# Patient Record
Sex: Female | Born: 1955 | Race: White | Hispanic: No | Marital: Married | Smoking: Never smoker
Health system: Southern US, Community
[De-identification: ages and names within clinical notes are randomized; demographics above are authoritative.]

## PROBLEM LIST (undated history)

## (undated) DIAGNOSIS — M549 Dorsalgia, unspecified: Principal | ICD-10-CM

## (undated) DIAGNOSIS — T4145XA Adverse effect of unspecified anesthetic, initial encounter: Secondary | ICD-10-CM

## (undated) DIAGNOSIS — E7800 Pure hypercholesterolemia, unspecified: Secondary | ICD-10-CM

## (undated) DIAGNOSIS — G8929 Other chronic pain: Secondary | ICD-10-CM

## (undated) DIAGNOSIS — T8859XA Other complications of anesthesia, initial encounter: Secondary | ICD-10-CM

## (undated) HISTORY — PX: PARTIAL HYSTERECTOMY: SHX80

## (undated) HISTORY — DX: Dorsalgia, unspecified: M54.9

## (undated) HISTORY — DX: Other chronic pain: G89.29

## (undated) HISTORY — PX: OTHER SURGICAL HISTORY: SHX169

---

## 1998-06-21 ENCOUNTER — Encounter: Payer: Self-pay | Admitting: Neurosurgery

## 1998-06-21 ENCOUNTER — Ambulatory Visit (HOSPITAL_COMMUNITY): Admission: RE | Admit: 1998-06-21 | Discharge: 1998-06-21 | Payer: Self-pay | Admitting: Neurosurgery

## 1998-07-05 ENCOUNTER — Ambulatory Visit (HOSPITAL_COMMUNITY): Admission: RE | Admit: 1998-07-05 | Discharge: 1998-07-05 | Payer: Self-pay | Admitting: Neurosurgery

## 1998-07-19 ENCOUNTER — Ambulatory Visit (HOSPITAL_COMMUNITY): Admission: RE | Admit: 1998-07-19 | Discharge: 1998-07-19 | Payer: Self-pay | Admitting: Neurosurgery

## 1998-07-26 ENCOUNTER — Other Ambulatory Visit: Admission: RE | Admit: 1998-07-26 | Discharge: 1998-07-26 | Payer: Self-pay | Admitting: Obstetrics & Gynecology

## 1998-09-18 ENCOUNTER — Encounter: Payer: Self-pay | Admitting: Obstetrics & Gynecology

## 1998-09-18 ENCOUNTER — Ambulatory Visit (HOSPITAL_COMMUNITY): Admission: RE | Admit: 1998-09-18 | Discharge: 1998-09-18 | Payer: Self-pay | Admitting: Obstetrics & Gynecology

## 1999-08-21 ENCOUNTER — Other Ambulatory Visit: Admission: RE | Admit: 1999-08-21 | Discharge: 1999-08-21 | Payer: Self-pay | Admitting: Obstetrics & Gynecology

## 2000-04-26 ENCOUNTER — Ambulatory Visit (HOSPITAL_COMMUNITY): Admission: RE | Admit: 2000-04-26 | Discharge: 2000-04-26 | Payer: Self-pay | Admitting: Neurosurgery

## 2000-04-26 ENCOUNTER — Encounter: Payer: Self-pay | Admitting: Neurosurgery

## 2000-09-22 ENCOUNTER — Other Ambulatory Visit: Admission: RE | Admit: 2000-09-22 | Discharge: 2000-09-22 | Payer: Self-pay | Admitting: Obstetrics & Gynecology

## 2001-03-04 ENCOUNTER — Emergency Department (HOSPITAL_COMMUNITY): Admission: EM | Admit: 2001-03-04 | Discharge: 2001-03-05 | Payer: Self-pay | Admitting: *Deleted

## 2001-03-04 ENCOUNTER — Encounter: Payer: Self-pay | Admitting: *Deleted

## 2001-03-15 ENCOUNTER — Encounter: Payer: Self-pay | Admitting: Internal Medicine

## 2001-03-15 ENCOUNTER — Ambulatory Visit (HOSPITAL_COMMUNITY): Admission: RE | Admit: 2001-03-15 | Discharge: 2001-03-15 | Payer: Self-pay | Admitting: Internal Medicine

## 2001-03-23 ENCOUNTER — Encounter: Admission: RE | Admit: 2001-03-23 | Discharge: 2001-03-23 | Payer: Self-pay | Admitting: Neurosurgery

## 2001-03-23 ENCOUNTER — Encounter: Payer: Self-pay | Admitting: Neurosurgery

## 2001-05-09 ENCOUNTER — Encounter: Payer: Self-pay | Admitting: Neurosurgery

## 2001-05-11 ENCOUNTER — Inpatient Hospital Stay (HOSPITAL_COMMUNITY): Admission: RE | Admit: 2001-05-11 | Discharge: 2001-05-14 | Payer: Self-pay | Admitting: Neurosurgery

## 2001-05-11 ENCOUNTER — Encounter: Payer: Self-pay | Admitting: Neurosurgery

## 2001-06-24 ENCOUNTER — Encounter: Admission: RE | Admit: 2001-06-24 | Discharge: 2001-06-24 | Payer: Self-pay | Admitting: Neurosurgery

## 2001-06-24 ENCOUNTER — Encounter: Payer: Self-pay | Admitting: Neurosurgery

## 2001-09-29 ENCOUNTER — Other Ambulatory Visit: Admission: RE | Admit: 2001-09-29 | Discharge: 2001-09-29 | Payer: Self-pay | Admitting: Obstetrics & Gynecology

## 2001-11-17 ENCOUNTER — Encounter: Admission: RE | Admit: 2001-11-17 | Discharge: 2001-11-17 | Payer: Self-pay | Admitting: Neurosurgery

## 2001-11-17 ENCOUNTER — Encounter: Payer: Self-pay | Admitting: Neurosurgery

## 2002-02-08 ENCOUNTER — Encounter: Admission: RE | Admit: 2002-02-08 | Discharge: 2002-02-08 | Payer: Self-pay | Admitting: Neurosurgery

## 2002-02-08 ENCOUNTER — Encounter: Payer: Self-pay | Admitting: Neurosurgery

## 2002-10-11 ENCOUNTER — Other Ambulatory Visit: Admission: RE | Admit: 2002-10-11 | Discharge: 2002-10-11 | Payer: Self-pay | Admitting: Obstetrics & Gynecology

## 2003-02-05 ENCOUNTER — Encounter: Payer: Self-pay | Admitting: Neurosurgery

## 2003-02-07 ENCOUNTER — Ambulatory Visit (HOSPITAL_COMMUNITY): Admission: RE | Admit: 2003-02-07 | Discharge: 2003-02-08 | Payer: Self-pay | Admitting: Neurosurgery

## 2003-02-07 ENCOUNTER — Encounter: Payer: Self-pay | Admitting: Neurosurgery

## 2003-02-14 ENCOUNTER — Inpatient Hospital Stay (HOSPITAL_COMMUNITY): Admission: RE | Admit: 2003-02-14 | Discharge: 2003-02-14 | Payer: Self-pay | Admitting: Neurosurgery

## 2003-05-07 ENCOUNTER — Other Ambulatory Visit: Admission: RE | Admit: 2003-05-07 | Discharge: 2003-05-07 | Payer: Self-pay | Admitting: Dermatology

## 2003-11-01 ENCOUNTER — Other Ambulatory Visit: Admission: RE | Admit: 2003-11-01 | Discharge: 2003-11-01 | Payer: Self-pay | Admitting: Obstetrics & Gynecology

## 2003-11-08 ENCOUNTER — Encounter: Admission: RE | Admit: 2003-11-08 | Discharge: 2003-11-08 | Payer: Self-pay | Admitting: Obstetrics & Gynecology

## 2003-12-11 ENCOUNTER — Encounter (HOSPITAL_COMMUNITY): Admission: RE | Admit: 2003-12-11 | Discharge: 2004-01-10 | Payer: Self-pay | Admitting: Neurosurgery

## 2004-01-11 ENCOUNTER — Encounter (HOSPITAL_COMMUNITY): Admission: RE | Admit: 2004-01-11 | Discharge: 2004-02-10 | Payer: Self-pay | Admitting: Neurosurgery

## 2004-02-11 ENCOUNTER — Encounter (HOSPITAL_COMMUNITY): Admission: RE | Admit: 2004-02-11 | Discharge: 2004-03-12 | Payer: Self-pay | Admitting: Neurosurgery

## 2004-10-15 ENCOUNTER — Encounter: Admission: RE | Admit: 2004-10-15 | Discharge: 2004-10-15 | Payer: Self-pay | Admitting: Neurosurgery

## 2004-10-30 ENCOUNTER — Ambulatory Visit (HOSPITAL_COMMUNITY): Admission: RE | Admit: 2004-10-30 | Discharge: 2004-10-30 | Payer: Self-pay | Admitting: Neurosurgery

## 2004-11-05 ENCOUNTER — Other Ambulatory Visit: Admission: RE | Admit: 2004-11-05 | Discharge: 2004-11-05 | Payer: Self-pay | Admitting: Obstetrics & Gynecology

## 2004-11-06 ENCOUNTER — Encounter: Admission: RE | Admit: 2004-11-06 | Discharge: 2004-11-06 | Payer: Self-pay | Admitting: Neurosurgery

## 2004-11-20 ENCOUNTER — Encounter: Admission: RE | Admit: 2004-11-20 | Discharge: 2004-11-20 | Payer: Self-pay | Admitting: Neurosurgery

## 2004-12-03 ENCOUNTER — Encounter: Admission: RE | Admit: 2004-12-03 | Discharge: 2004-12-03 | Payer: Self-pay | Admitting: Neurosurgery

## 2005-03-10 ENCOUNTER — Encounter: Admission: RE | Admit: 2005-03-10 | Discharge: 2005-03-10 | Payer: Self-pay | Admitting: Neurosurgery

## 2005-03-13 ENCOUNTER — Ambulatory Visit (HOSPITAL_COMMUNITY): Admission: AD | Admit: 2005-03-13 | Discharge: 2005-03-15 | Payer: Self-pay | Admitting: Neurosurgery

## 2005-07-31 ENCOUNTER — Ambulatory Visit (HOSPITAL_COMMUNITY): Admission: RE | Admit: 2005-07-31 | Discharge: 2005-07-31 | Payer: Self-pay | Admitting: Neurosurgery

## 2005-12-10 ENCOUNTER — Other Ambulatory Visit: Admission: RE | Admit: 2005-12-10 | Discharge: 2005-12-10 | Payer: Self-pay | Admitting: Obstetrics & Gynecology

## 2005-12-28 ENCOUNTER — Encounter: Admission: RE | Admit: 2005-12-28 | Discharge: 2005-12-28 | Payer: Self-pay | Admitting: Obstetrics & Gynecology

## 2006-02-03 ENCOUNTER — Encounter: Payer: Self-pay | Admitting: Neurosurgery

## 2006-02-05 ENCOUNTER — Inpatient Hospital Stay (HOSPITAL_COMMUNITY): Admission: RE | Admit: 2006-02-05 | Discharge: 2006-02-06 | Payer: Self-pay | Admitting: Neurosurgery

## 2006-04-21 ENCOUNTER — Encounter: Admission: RE | Admit: 2006-04-21 | Discharge: 2006-04-21 | Payer: Self-pay | Admitting: Neurosurgery

## 2006-05-10 ENCOUNTER — Ambulatory Visit (HOSPITAL_COMMUNITY): Admission: RE | Admit: 2006-05-10 | Discharge: 2006-05-10 | Payer: Self-pay | Admitting: Internal Medicine

## 2006-05-10 ENCOUNTER — Ambulatory Visit: Payer: Self-pay | Admitting: Internal Medicine

## 2006-10-12 IMAGING — CT CT L SPINE W/ CM
3 of 7 series · 13 of 33 positions shown, 15 images · IV contrast (omnipaque)
Comparison: none

CLINICAL DATA: Spondylosis.  Left leg pain.  Prior interbody fusion.  Lumbar myelogram to include lower thoracic region.
CT LUMBAR MYELOGRAM WITH CONTRAST:
20cc of Omnipaque 180.
Fluoroscopic guidance was provided by Dr. Lakeisha Chakravarty.  He instilled Omnipaque into the subarachnoid space at the L2-3 level.  I obtained routine myelographic films including views of the lower thoracic region in this patient who has a dorsal column stimulator.
Five non-rib-bearing lumbar vertebrae.   interbody cages at L4-5.  Dorsum column stimulator electrodes enter in the upper lumbar region right around L1-2 level and the electrodes span over lower thoracic vertebrae T10, T11, and T12.
Interbody cages at L4-5 appear in satisfactory position.  Negative for nerve root cutoff or attenuation of root sleeves.  Mild anterior extradural defect L2-3.  Cross-table lateral view revealing minimal retrolisthesis at L2 and L3 by a couple millimeters.Mild subsidence of cages into L4 and L5 bodies appears unremarkable.  Incidentally unfused ring apophysis (limbus vertebrae) at L4, a variant of normal.

[Series 102: l-spine helical · axial · 0.41mm/px · z∈[-216,+39]mm · 7 of 545 slices shown, 9 images]
[im 69/545  soft-tissue]
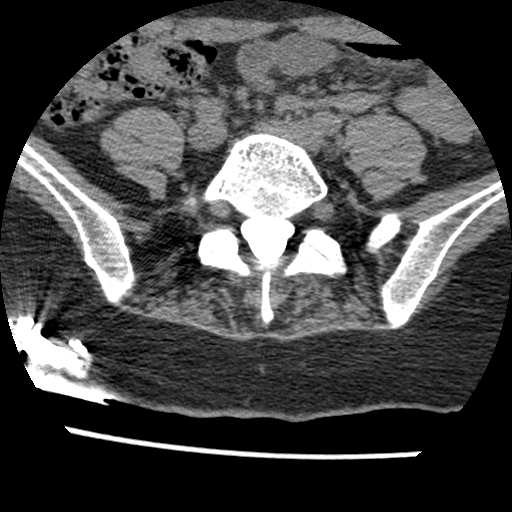
[im 69/545  bone]
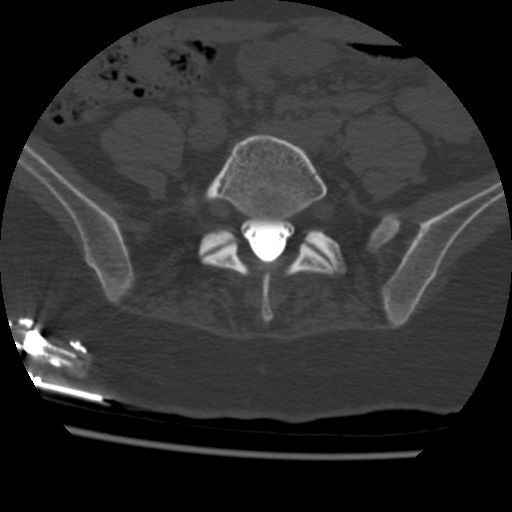
[im 137/545  bone]
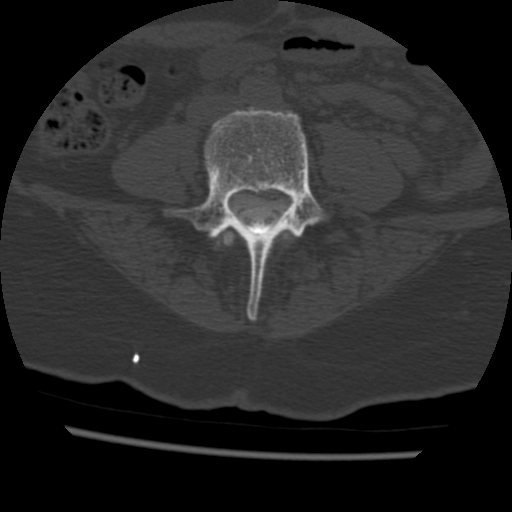
[im 205/545  bone]
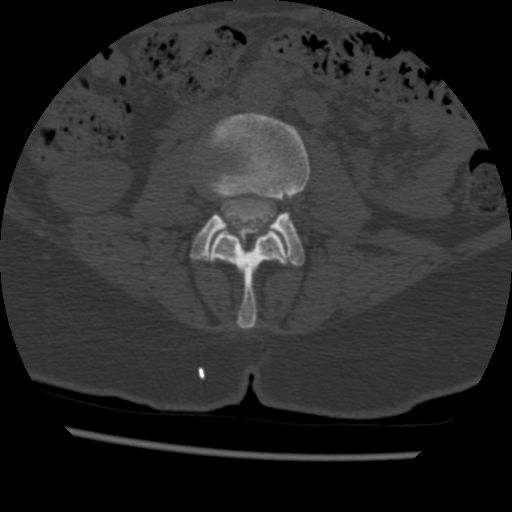
[im 273/545  bone]
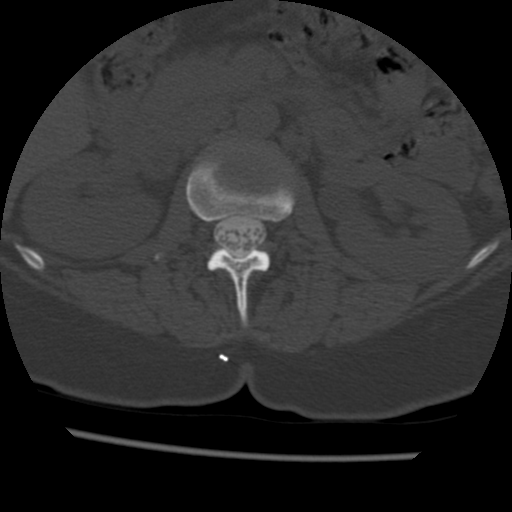
[im 341/545  soft-tissue]
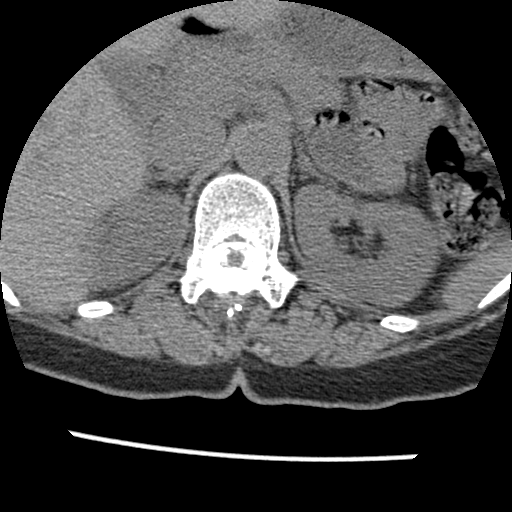
[im 341/545  bone]
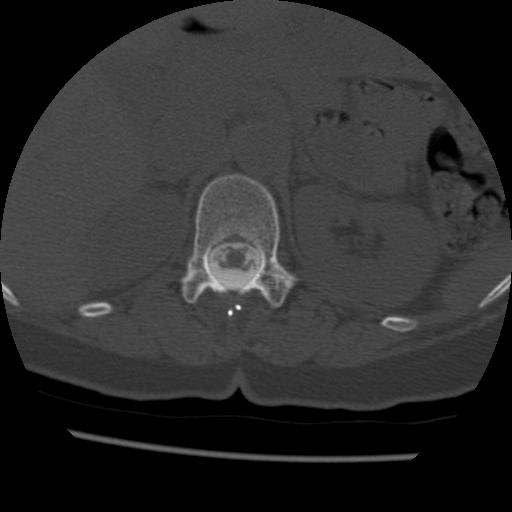
[im 409/545  bone]
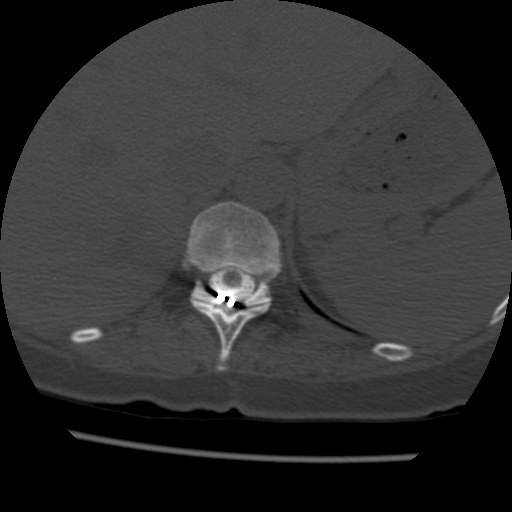
[im 477/545  bone]
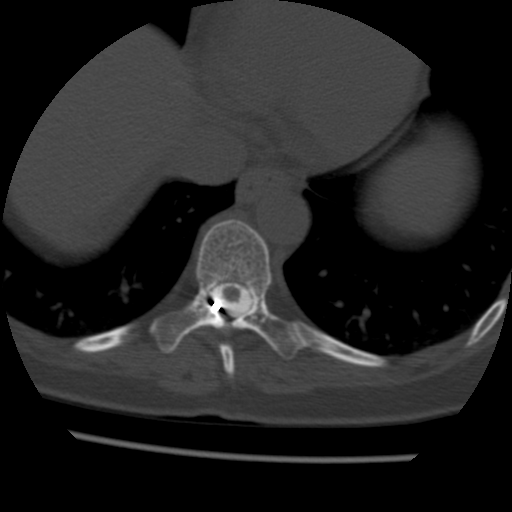

[Series 103: reformatted · coronal · 0.68mm/px · 5 of 64 slices shown (1 of 2)]
[im 11/64  bone]
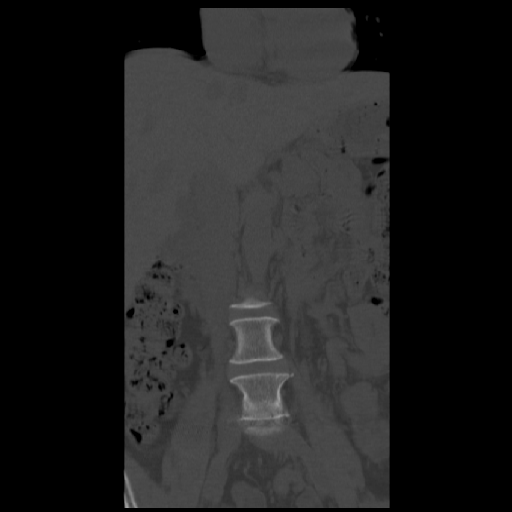
[im 22/64  bone]
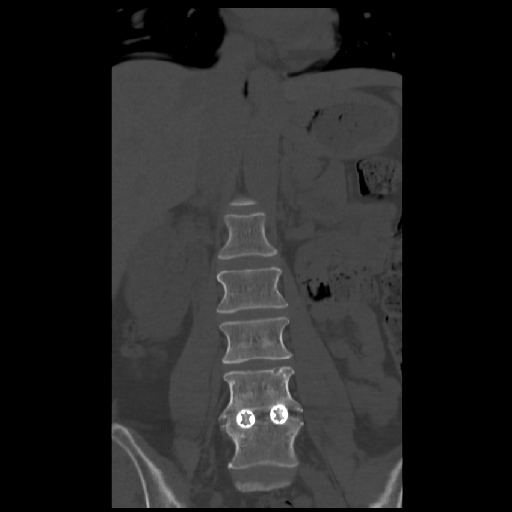
[im 32/64  bone]
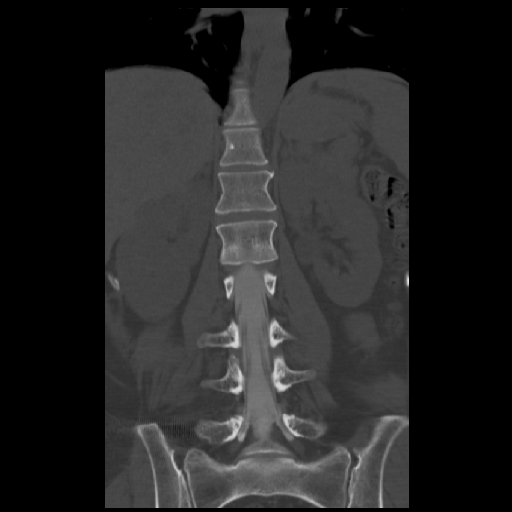
[im 43/64  bone]
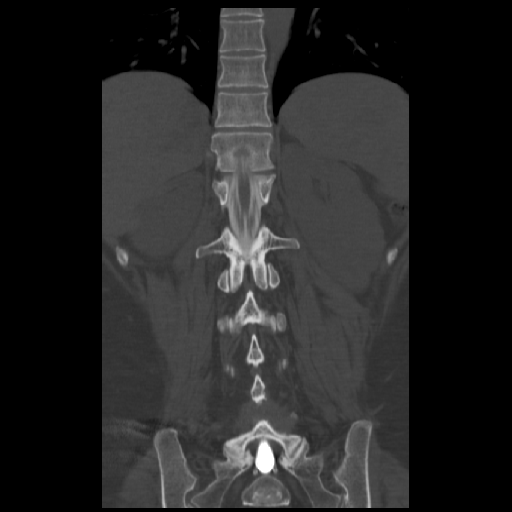
[im 53/64  bone]
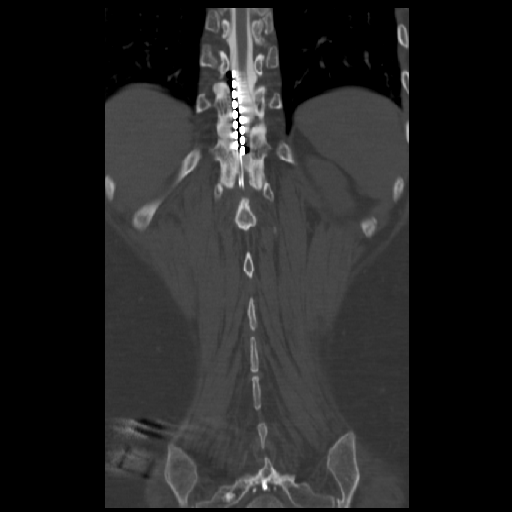

[Series 105: reformatted · sagittal · 0.68mm/px · 1 of 42 slices shown (2 of 2)]
[im 21/42  bone]
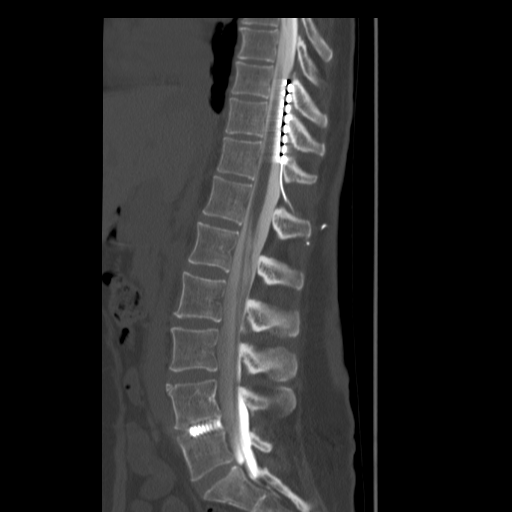

[13 of 33 positions shown; findings below may reference images not displayed]

IMPRESSION: Unremarkable lumbar myelogram.  See comments above.  CT myelography is to follow.
POST-MYELOGRAM CT SCAN (INTRATHECAL CONTRAST OMNIPAQUE 180):
Following the myelogram, transaxial cuts were acquired from the inferior T7 to S1 level.From the axial dataset,images were reconstructed in coronal and sagittal planes.  Findings are as follows.  Lower thoracic region unremarkable.  Dorsal column stimulator leads/electrodes are noted entering the posterior aspect of the spinal canal at T11-12 level.  Normal caliber of the conus medullaris and thoracic cord up to T8 level.  Minimal shallow disc protrusion paracentral and posterolateral left L2-3.  Slightly larger but still small, broad based and shallow disc protrusion posterolateral/lateral on the left at L3-4 with encroachment on the left L3 nerve root in the lateral aspect of the foramen and importantly caudal extension of herniated nuclear material behind the mid superior aspect of L4 body in the midline and to the left where it involves the left L4 lateral recess and L4 nerve root.Minimal anterolisthesis of L3 on L4 by approximately 3mm.  No pars defects.Degenerative facet arthropathic changes.  
At L4-5, postoperative changes are noted.  Fusion plugs in satisfactory position.  unusual sclerosis or lysis about the cages.  Again, cages are well positioned.  On the right there is a curvilinear density projecting off the posterolateral and inferior aspect of the interspace or L4 body and projecting into the right L3-4 foramen but not appearing to cause mass effect on the exiting right L4 nerve root.  This density, possibly a piece of bone, is located in the inferior aspect of the right upper foramen.There may be a fracture, possibly a stress fracture, involving the right aspect of pedicle and vertebral body of L5.  If this does represent a fracture, it may be healing,i.e. the margin of sclerosis, or it may simply represent a prominent anterior trabecular groove.
The L5-S1 level is unremarkable.  No stenosis.  Mild posterior annular bulging slightly eccentrically prominent on the left.  The S1 nerve root sleeves are well opacified.
IMPRESSION: The main observation is the HNP on the left at L3-4 with caudal extension into the left L4 lateral recess.

## 2008-11-29 ENCOUNTER — Encounter: Admission: RE | Admit: 2008-11-29 | Discharge: 2008-11-29 | Payer: Self-pay | Admitting: Neurosurgery

## 2009-01-01 ENCOUNTER — Encounter: Admission: RE | Admit: 2009-01-01 | Discharge: 2009-01-01 | Payer: Self-pay | Admitting: Neurosurgery

## 2009-02-19 ENCOUNTER — Inpatient Hospital Stay (HOSPITAL_COMMUNITY): Admission: RE | Admit: 2009-02-19 | Discharge: 2009-03-01 | Payer: Self-pay | Admitting: Neurosurgery

## 2009-03-07 ENCOUNTER — Encounter (INDEPENDENT_AMBULATORY_CARE_PROVIDER_SITE_OTHER): Payer: Self-pay | Admitting: Neurosurgery

## 2009-03-07 ENCOUNTER — Ambulatory Visit: Payer: Self-pay | Admitting: Vascular Surgery

## 2009-03-07 ENCOUNTER — Ambulatory Visit: Admission: RE | Admit: 2009-03-07 | Discharge: 2009-03-07 | Payer: Self-pay | Admitting: Neurosurgery

## 2011-01-13 LAB — TYPE AND SCREEN
ABO/RH(D): O POS
Antibody Screen: NEGATIVE

## 2011-01-13 LAB — DIFFERENTIAL
Basophils Absolute: 0 10*3/uL (ref 0.0–0.1)
Basophils Relative: 1 % (ref 0–1)
Eosinophils Absolute: 0.1 10*3/uL (ref 0.0–0.7)
Eosinophils Relative: 1 % (ref 0–5)
Neutrophils Relative %: 58 % (ref 43–77)

## 2011-01-13 LAB — COMPREHENSIVE METABOLIC PANEL
ALT: 36 U/L — ABNORMAL HIGH (ref 0–35)
AST: 34 U/L (ref 0–37)
CO2: 30 mEq/L (ref 19–32)
Calcium: 10 mg/dL (ref 8.4–10.5)
Chloride: 106 mEq/L (ref 96–112)
Creatinine, Ser: 0.63 mg/dL (ref 0.4–1.2)
GFR calc Af Amer: >60 mL/min (ref >60)
GFR calc non Af Amer: >60 mL/min (ref >60)
Glucose, Bld: 97 mg/dL (ref 70–99)
Sodium: 142 mEq/L (ref 135–145)
Total Bilirubin: 0.5 mg/dL (ref 0.3–1.2)

## 2011-01-13 LAB — CBC
Hemoglobin: 13.3 g/dL (ref 12.0–15.0)
MCHC: 33.6 g/dL (ref 30.0–36.0)
MCV: 88.3 fL (ref 78.0–100.0)
RBC: 4.48 MIL/uL (ref 3.87–5.11)
WBC: 4.9 10*3/uL (ref 4.0–10.5)

## 2011-01-13 LAB — URINALYSIS, ROUTINE W REFLEX MICROSCOPIC
Glucose, UA: NEGATIVE mg/dL
Hgb urine dipstick: NEGATIVE
Specific Gravity, Urine: 1.009 (ref 1.005–1.030)
pH: 6.5 (ref 5.0–8.0)

## 2011-01-13 LAB — URINE MICROSCOPIC-ADD ON

## 2011-01-13 LAB — PROTIME-INR
INR: 1 (ref 0.00–1.49)
Prothrombin Time: 12.9 seconds (ref 11.6–15.2)

## 2011-02-17 NOTE — Discharge Summary (Signed)
NAMECHARLII, [name]                ACCOUNT NO.:  [id number]   MEDICAL RECORD NO.:  [id number]          PATIENT TYPE:  INP   LOCATION:  3041                         FACILITY:  [facility]   PHYSICIAN:  Payton Doughty, M.D.      DATE OF BIRTH:  [birth year]   DATE OF ADMISSION:  02/19/2009  DATE OF DISCHARGE:  03/01/2009                               DISCHARGE SUMMARY   ADMITTING DIAGNOSIS:  Spondylosis at L3-4.   DISCHARGE DIAGNOSIS:  Spondylosis at L3-4.   PROCEDURE:  L3-4 anterolateral interbody fusion.   COMPLICATION:  Left psoas muscle weakness.   DISCHARGE STATUS:  Alive and well.   BODY TEXT:  A 55 year old right-handed white girl whose history and  physical is recounted in the chart.  She had a L4-5 fusion about 10  years ago following an injury, falling of a horse.  She developed  adjacent segment disease, admitted for L3-4 XLIF.  Medical history is  otherwise benign.  General exam was intact.  Neurologic exam shows  dorsiflexion weakness on the right side and reflexes were absent in  knees and ankles.  She was admitted after ascertaining normal laboratory  values and underwent L3-4 XLIF postoperatively.  She had a lot of left  leg pain, and it should be noted intraoperatively, she had a lot of  repositioning of the retractor because of positive EMG stimulation.  In  the postoperative period, she was placed on baclofen, and Neurontin was  increased, remained on Celebrex.  She has weakness of about 2/5 in the  iliopsoas on the left side.  Knee extensor strength was initially quite  poor and it continues to improve.  She is now walking with a walker and  in the iliopsoas is about 3-4, and the quads on the left side continuing  to improve.  She also developed some vertigo with nausea and vomiting,  was started on meclizine to good avail. Now, she is up and about using a  walker.  She is being discharged home in the care of her family.  She is  going to be discharged on Percocet,  Neurontin, meclizine, and baclofen.  Her followup will be in the [facility] offices in about a week for suture  removal.           ______________________________  Payton Doughty, M.D.     MWR/MEDQ  D:  03/01/2009  T:  03/01/2009  Job:  [id number]

## 2011-02-17 NOTE — H&P (Signed)
NAMEHONESTII, [name]                ACCOUNT NO.:  [id number]   MEDICAL RECORD NO.:  [id number]          PATIENT TYPE:  INP   LOCATION:  3041                         FACILITY:  [facility]   PHYSICIAN:  Payton Doughty, M.D.      DATE OF BIRTH:  [birth year]   DATE OF ADMISSION:  02/19/2009  DATE OF DISCHARGE:                              HISTORY & PHYSICAL   ADMISSION DIAGNOSIS:  Spondylosis at L3-4.   BODY OF TEXT:  This is a very nice, somewhat unfortunate 55 year old  right-handed white girl who several years ago fell from horse, had a  disk at L4-5, injured her 5 root and ended up with a fusion at L4-5, did  relatively well but has had persistent leg pain, has a spinal cord  stimulator for control her leg pain.  She has recently developed pain in  her back and down into her right leg.  The diskography was obtained that  strongly in positive concordant at L3-4 and she is now admitted for an  L3-4 anterolateral interbody fusion.   Medical history is otherwise unremarkable for significant diseases.  She  has had hysterectomy.  She has also had anterior decompression and  fusion.   She has no allergies.   She uses Lorcet.   SOCIAL HISTORY:  She does not smoke, does not drinks on disability.   FAMILY HISTORY:  Mom is 16 in good health.  Dad is 57 with polyarteritis  nodosa, heart disease, and prostate cancer.   REVIEW OF SYSTEMS:  Remarkable for her neck and back pain.   PHYSICAL EXAMINATION:  HEENT:  Normal limits.  NECK:  She has reasonable range of motion in her neck.  CHEST:  Clear.  CARDIAC:  Regular rate and rhythm.  ABDOMEN:  Nontender.  No hepatosplenomegaly.  EXTREMITIES:  No clubbing, cyanosis.  Peripheral pulses are good.  GU:  Deferred.  NEUROLOGIC:  She is awake, alert, and oriented.  Cranial nerves are  intact.  Motor exam shows 5/5 strength throughout the upper extremities.  Lower extremities, she has some mild weakness in dorsiflexors on the  right side.  Reflexes are  absent at the knees and ankles.   MR and diskography results have been reviewed above.   CLINICAL IMPRESSION:  L3-4 spondylosis and adjacent segment disease.  Plans for an L3-4 anterolateral interbody fusion using XLIF technique.  The risks and benefits have been discussed with her and she wished to  proceed.   .           ______________________________  Payton Doughty, M.D.     MWR/MEDQ  D:  02/19/2009  T:  02/19/2009  Job:  [id number]

## 2011-02-17 NOTE — Op Note (Signed)
NAMECHANCEY, CULLINANE                ACCOUNT NO.:  [id number]   MEDICAL RECORD NO.:  [id number]          PATIENT TYPE:  INP   LOCATION:  3041                         FACILITY:  [facility]   PHYSICIAN:  Payton Doughty, M.D.      DATE OF BIRTH:  18-Nov-1955   DATE OF PROCEDURE:  02/19/2009  DATE OF DISCHARGE:                               OPERATIVE REPORT   PREOPERATIVE DIAGNOSIS:  Severe spondylosis at L3-4.   POSTOPERATIVE DIAGNOSIS:  Severe spondylosis at L3-4.   PROCEDURE:  L3-4 extreme anterolateral interbody fusion using the XLIF  technique.   SURGEON:  Payton Doughty, MD.   ANESTHESIA:  General endotracheal.   PREPARATION:  Prepped and draped with alcohol wipe.   COMPLICATIONS:  None.   NURSE ASSISTANT:  Bedelia Person, MD   DOCTOR ASSISTANT:  Hewitt Shorts, MD   BODY OF TEXT:  This is a 55 year old girl with severe spondylosis at L3-  4, was taken to the operative room, smoothly anesthetized and intubated,  placed in the right side down left side up lateral decubitus position,  secured to the table and prepared for EMG monitoring.  Following shave,  prep, and drape in usual sterile fashion, 2 skin incisions were made,  one directly lateral and one slightly posterolateral on the left side  using fluoroscopic guidance over the L3-4 interspace.  The  retroperitoneal space was accessed via these incisions, the posterior  one used for the finger to clear the space and the lateral one used to  insert the electrode for EMG monitoring as the electrode was advanced to  the psoas muscle over the L3-4 interspace.  The 3-4 interspace was  approached.  There was no EMG activity with a successive dilation plane.  The retractor was placed, and the handheld stimulator used.  This evoked  responses at the posterior blade.  The redilation was done moving the  anchor point anteriorly and in this fashion was able to get back up to 9  mA and felt to be safe for placement of retractor.  The shim was  placed  into the disk space, retractor overlying the source was placed.  Diskectomy was carried out through this, and after the diskectomy in  preparation of the endplates, a 45-mm x 8-mm x 18-mm device was placed  in the interspace.  It was filled with Osteocel.  The lateral plate was  then also applied.  The x-ray showed good placement of device and plate.  The retractor system was withdrawn with no bleeding.  A 0 Vicryl, 2-0  Vicryl, and 3-0 nylon were used to close the incisions.  The patient  returned to recovery room in good condition.          ______________________________  Payton Doughty, M.D.    MWR/MEDQ  D:  02/19/2009  T:  02/20/2009  Job:  [id number]

## 2011-02-20 NOTE — H&P (Signed)
[name], [name]                ACCOUNT NO.:  [id number]   MEDICAL RECORD NO.:  [id number]          PATIENT TYPE:  INP   LOCATION:  2899                         FACILITY:  [facility]   PHYSICIAN:  Payton Doughty, M.D.      DATE OF BIRTH:  1956-04-06   DATE OF ADMISSION:  02/05/2006  DATE OF DISCHARGE:                                HISTORY & PHYSICAL   ADMITTING DIAGNOSIS:  Nonfunctioning spinal cord stimulator.   SERVICE:  Neurosurgery.   BODY OF TEXT:  A very nice 55 year old, right handed, white girl who had a  spinal cord stimulator placed a couple of years ago, did well, had good  coverage.  We switched it over to a battery from a receiver in October and  her coverage declined.  There is no visible reason for it and then using the  magnifying glass over an x-ray, I believe we found that the wires were  fatigued at the battery contact point and she is now admitted for  replacement of the stimulator.   MEDICAL HISTORY:  Otherwise very unremarkable.  She had a hysterectomy in  1987 and has no allergies.  She has had a lumbar fusion for a severely  injured L3-L4 disk that caused her to have injury of the nerve root.   ALLERGIES:  None.   MEDICATIONS:  Lorcet and Lodine.   SOCIAL HISTORY:  She does not smoke and does not drink and is on disability.   PHYSICAL EXAMINATION:  HEENT:  Within normal limits.  NECK:  Good range of motion.  CHEST:  Clear.  CARDIAC:  Regular rate and rhythm.  ABDOMEN:  Nontender with no hepatosplenomegaly.  EXTREMITIES:  Without clubbing or cyanosis.  Peripheral pulses are good.  GU:  Deferred.  NEUROLOGIC:  She is awake, alert, and oriented.  Cranial nerves are intact.  Motor exam shows 5/5 strength throughout the upper extremities, the left  lower extremity, the right lower extremity she has weakness in the extensors  and that has been there for numerous years.   CLINICAL IMPRESSION:  Nonfunctioning spinal cord stimulator.   PLAN:  Switch it out.   The risks and benefits of this approach have been  discussed with her and she wishes to proceed.           ______________________________  Payton Doughty, M.D.     MWR/MEDQ  D:  02/05/2006  T:  02/05/2006  Job:  [id number]

## 2011-02-20 NOTE — H&P (Signed)
   NAME:  [name], SCHOENBECK                          ACCOUNT NO.:  [id number]   MEDICAL RECORD NO.:  [id number]                   PATIENT TYPE:  INP   LOCATION:  3013                                 FACILITY:  [facility]   PHYSICIAN:  Payton Doughty, M.D.                   DATE OF BIRTH:  April 14, 1956   DATE OF ADMISSION:  02/14/2003  DATE OF DISCHARGE:                                HISTORY & PHYSICAL   ADMISSION DIAGNOSIS:  Implanted spinal cord stimulator.   SERVICE:  Neurosurgery.   HISTORY OF PRESENT ILLNESS:  A 55 year old right-handed white female who had  a spinal cord stimulator placed last week for trial.  Having completed a  successful trial for her right hip and leg pain, she is now admitted for  implantation receiver.   PAST MEDICAL HISTORY:  This is unremarkable for significant disease.   PAST SURGICAL HISTORY:  She had a hysterectomy in 1987.  She had lumbar  fusion at L4-5 and anterior cervicectomy and fusion.   MEDICATIONS:  Lorcet and ciprofloxacin.   HABITS:  The patient does not smoke or drink.   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   FAMILY HISTORY:  Mom is 75 and in good health.  Dad is 58 and in mediocre  health with polyarteritis.   REVIEW OF SYSTEMS:  This is remarkable for back and leg pain.   PHYSICAL EXAMINATION:  HEENT:  Within normal limits.  NECK:  She has good range of motion of the neck.  CHEST:  Clear.  CARDIOVASCULAR:  Regular rate and rhythm.  ABDOMEN:  Nontender with no hepatosplenomegaly.  EXTREMITIES:  No clubbing, cyanosis, or edema.  GU:  Examination is deferred.  VASCULAR:  Peripheral pulses are good.  NEUROLOGIC:  She is awake, alert and oriented.  Cranial nerves are intact.  Motor examination shows 5/5 strength throughout the upper and lower  extremities.  No current sensory deficit.  She has good coverage in the  distribution of her pain from her spinal cord stimulator.  Incision is  well  healed.    IMPRESSION:  Successful spinal cord stimulator trial.   PLAN:  She is going to be admitted for placement of the receiver.  This will  be a 23-hour admission.                                               Payton Doughty, M.D.    MWR/MEDQ  D:  02/14/2003  T:  02/14/2003  Job:  [id number]

## 2011-02-20 NOTE — Op Note (Signed)
   NAME:  [name], [name]                          ACCOUNT NO.:  [id number]   MEDICAL RECORD NO.:  [id number]                   PATIENT TYPE:  INP   LOCATION:  NA                                   FACILITY:  [facility]   PHYSICIAN:  Payton Doughty, M.D.                   DATE OF BIRTH:  29-May-1956   DATE OF PROCEDURE:  02/07/2003  DATE OF DISCHARGE:                                 OPERATIVE REPORT   PREOPERATIVE DIAGNOSIS:  Chronic right intractable leg pain.   POSTOPERATIVE DIAGNOSIS:  Chronic right intractable leg pain.   OPERATION PERFORMED:  Placement of epidural spinal cord stimulator.   SURGEON:  Payton Doughty, M.D.   ASSISTANTBasilia Jumbo.   ANESTHESIA:  General endotracheal.   PREP:  Sterile Betadine prep and scrub with alcohol wipe.   COMPLICATIONS:  None.   INDICATIONS FOR PROCEDURE:  The patient is a 55 year old right-handed white  female with chronic intractable right leg pain.  She was taken to the  operating room, smoothly anesthetized and intubated and placed prone on the  operating table.  Following shave, prep and drape in the usual sterile  fashion, the marker was placed and x-ray obtained to confirm correctness of  level.  The skin was incised from the bottom of T12 to the top of T11 and  the lamina of T12 were exposed bilaterally in the subperiosteal plane.  Partial laminectomy of T11 was carried out and the ligamentum flavum removed  to expose the epidural space.  Stimulating electrode paddle was then placed  into the epidural space.  X-ray was obtained that showed it to be off to the  right side and extending from mid-T9 to mid-T11.  It was deemed to be  satisfactory position.  It was therefore attached to the lamina at T12 using  the cups provided with the kit.  It was connected to the extension cord  which exited by subcutaneous tunnel to the left side of the incision.  The  extension cords were connected and secured with the waterproof cups.  The  entire area  was irrigated and hemostasis assured.  The paraspinous muscles,  fascia and subcutaneous tissue were reapproximated using 3-0 Vicryl in  interrupted fascia and skin was closed using nylon in a running locked  fashion.  Betadine Telfa dressing was applied at both sites and the patient  returned to recovery room in good condition.   DESCRIPTION OF PROCEDURE:                                                Payton Doughty, M.D.    MWR/MEDQ  D:  02/07/2003  T:  02/07/2003  Job:  [id number]

## 2011-02-20 NOTE — H&P (Signed)
NAMEAURIANNA, [name]                ACCOUNT NO.:  [id number]   MEDICAL RECORD NO.:  [id number]          PATIENT TYPE:  OIB   LOCATION:  3009                         FACILITY:  [facility]   PHYSICIAN:  Payton Doughty, M.D.      DATE OF BIRTH:  1956-07-14   DATE OF ADMISSION:  03/13/2005  DATE OF DISCHARGE:                                HISTORY & PHYSICAL   ADMISSION DIAGNOSIS:  Herniated disk at L3-4 on the left.   BODY OF TEXT:  This is a very nice now 55 year old right-handed white female  who has had fusion at L4-5 and placement of spinal cord stimulator. He has  done well until about two weeks ago when he then started having marked  increased in her pain in proximal left leg anteriorly. She had had a prior  myelogram that showed a disk at L2-3 and L3-4. Repeat CT scan demonstrated  increased size in disk at L3-4 and she is now admitted for L3-4 diskectomy.   CLINICAL HISTORY:  Unremarkable for significant disease.   PAST SURGICAL HISTORY:  Hysterectomy in 1987, lumbar fusion at L4-5, and  anterior cervical decompression and fusion in the past as well.   MEDICATIONS:  Lorcet.   SOCIAL HISTORY:  She does not smoke or drink. She is on disability.   FAMILY HISTORY:  Mother is 53 and in good health. Father is 53 and in  mediocre health with polyarteritis.   REVIEW OF SYSTEMS:  Remarkable for back and leg pain.   PHYSICAL EXAMINATION:  HEENT: Within normal limits. She has reasonable range  of motion of her neck.  CHEST: Clear.  CARDIAC: Regular rate and rhythm.  ABDOMEN: Nontender without hepatosplenomegaly.  EXTREMITIES: Without clubbing or cyanosis.  GU: Deferred.  PULSES: Peripheral pulses good.  NEUROLOGIC: She is awake, alert, and oriented. Cranial nerves intact. Motor  exam shows 5/5 strength in the lower extremities except for the deep  extensors on the left at 5-/5. Positive straight leg raise on the left. Knee  jerk on the left is flicker, on the right is 2.   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   PLAN:  Lumbar laminectomy and diskectomy. The risks and benefits have been  discussed with her and she wishes to proceed.       MWR/MEDQ  D:  03/13/2005  T:  03/13/2005  Job:  [id number]

## 2011-02-20 NOTE — Op Note (Signed)
[facility]  Patient:    [name], [name]                       MRN: [id number] Proc. Date: 05/11/01 Adm. Date:  [id number] Attending:  Emeterio Reeve                           Operative Report  PREOPERATIVE DIAGNOSIS:  Spondylosis L4-5.  POSTOPERATIVE DIAGNOSIS:  Spondylosis L4-5.  OPERATION PERFORMED:  L4-5 laminectomy and diskectomy with posterior lumbar interbody fusion with Ray threaded fusion cage.  SURGEON:  Payton Doughty, M.D.  ANESTHESIA:  General endotracheal.  PREP:  Sterile Betadine prep and scrub with alcohol wipe.  COMPLICATIONS:  None.  ASSISTANT:  Hewitt Shorts, M.D.  DESCRIPTION OF PROCEDURE:  The patient is a 55 year old right-handed white female with severe spondylosis L4-5, right L4 and L5 radiculopathy.  The patient was taken to the operating room, smoothly anesthetized and intubated, and placed prone on the operating table.  Following shave, prep and drape in the usual sterile fashion skin was infiltrated with 1% lidocaine 1:400,000 The skin was incised from mid-L3 to midp-S1.  The lumen of L4 and L5 were exposed bilaterally in the subperiosteal plane out over the facet joints. Intraoperative x-ray confirmed correctness of the level.  The pars interarticularis, lamina and inferior facet of L4 and superior facet of L5 were removed bilaterally.  The ligamentum flavum was removed and the 4 and 5 roots dissected free bilaterally.  On the right side, there was significant compression of the lateral recess by bony overgrowth and disk.  Following complete decompression of the nerve roots and diskectomy, 12 x 21 mm Ray threaded fusion cages were then placed at 4-5.  Intraoperative x-ray showed good placement of the cages.  They were packed with bone graft harvested from the facet joints and capped.  The wound was irrigated and hemostasis was assured.  The fascia was reapproximated with 0 Vicryl in interrupted  fashion, subcutaneous tissues reapproximated with 0 Vicryl in interrupted fashion, subcuticular tissues reapproximated with 3-0 Vicryl in interrupted fashion. The skin was closed with 3-0 nylon in a running locked fashion.  Betadine Telfa dressing was applied and made occlusive with OpSite.  The patient was transferred to the recovery room in good condition. DD:  05/11/01 TD:  05/11/01 Job: [id number]

## 2011-02-20 NOTE — Op Note (Signed)
NAMEADELLYN, CAPEK                ACCOUNT NO.:  [id number]   MEDICAL RECORD NO.:  [id number]          PATIENT TYPE:  OIB   LOCATION:  3009                         FACILITY:  [facility]   PHYSICIAN:  Payton Doughty, M.D.      DATE OF BIRTH:  November 10, 1955   DATE OF PROCEDURE:  03/13/2005  DATE OF DISCHARGE:                                 OPERATIVE REPORT   PREOPERATIVE DIAGNOSIS:  Herniated disk on the left side at L3-4.   POSTOPERATIVE DIAGNOSIS:  Herniated disk on the left side at L3-4.   OPERATIVE PROCEDURES:  L3-4 left laminectomy and diskectomy.   SURGEON:  Payton Doughty, M.D.   ANESTHESIA:  General endotracheal.   PREPARATIONS:  Prepped with Betadine and scrubbed with alcohol wipe.   COMPLICATIONS:  None.   NURSING ASSISTANT:  [place].   DOCTOR ASSISTANT:  Hilda Lias, M.D.   INDICATIONS:  A 55 year old girl with a herniated disk at L3-4 on the left  and left leg pain.   DESCRIPTION OF PROCEDURE:  Taken to the operating room and smoothly  intubated.  Placed prone on the operating table.  Following shave, prep and  drape in the usual sterile fashion, the skin was infiltrated with 1%  lidocaine and 1:400,000 epinephrine.  The skin was incised over the L3-4  interspace.  The lamina of L3 was dissected free in subperiosteal plane.  Intraoperative x-ray confirmed the correctness of level.  Having confirmed  the correctness of level, semihemilaminectomy L3 was carried out to the top  ligamentum flavum that was removed in a retrograde fashion.  Lateral  dissection then revealed the lateral margin of the L4 root.  It was gently  elevated and immediate underneath was found a large free fragment of  herniated disk that was removed without difficulty.  Because the large piece  of disk was found, the disk space itself was not explored as it was felt  that this was the primary cause of the patient's left leg pain.  The  laminotomy site was irrigated and hemostasis assured.  It was  filled with  Depo-Medrol-soaked fat.  Successive layers of 0 Vicryl and 2-0 Vicryl were  used to close the subcutaneous tissues and 4-0 Vicryl in a running  subcuticular fashion was used to close the skin.  Benzoin and Steri-Strips  were placed and made occlusive with Telfa and Op-Site.  The patient returned  to the recovery room in good condition.      MWR/MEDQ  D:  03/13/2005  T:  03/14/2005  Job:  [id number]

## 2011-02-20 NOTE — Op Note (Signed)
[name], [name]                ACCOUNT NO.:  [id number]   MEDICAL RECORD NO.:  [id number]          PATIENT TYPE:  INP   LOCATION:  3172                         FACILITY:  [facility]   PHYSICIAN:  Payton Doughty, M.D.      DATE OF BIRTH:  1956/04/23   DATE OF PROCEDURE:  02/05/2006  DATE OF DISCHARGE:                                 OPERATIVE REPORT   PREOPERATIVE DIAGNOSIS:  Nonfunctioning spinal cord stimulator.   POSTOPERATIVE DIAGNOSIS:  Nonfunctioning spinal cord stimulator.   OPERATIVE PROCEDURE:  Replacement of spinal cord stimulator.   SURGEON:  Payton Doughty, M.D.   INDICATIONS FOR PROCEDURE:  This is a 55 year old right-handed white girl  who had spinal cord stimulator placed about a year and a half ago.  It had  been working well.  After a battery change, it stopped working and there is  some question of a separation of wires.  She was taken to the operating  room, intubated, and placed prone on the operating table.  Back was shaved,  prepped and draped in the usual sterile fashion.  Skin was incised in the  old skin site and the wires identified.  Dissection was carried down the  wires, through the laminectomy site.  The electrode was mobilized and  removed and new electrode placed.  Intraoperative x-ray showed it was in  identical position to the electrode that had been working well.  The wires  were then tunneled down to the old battery site.  The battery was mobilized.  As it was removed, there was a question of loosening of one of the wires at  the battery.  It was sent off intact to the factory for inspection.  New  battery was placed after the wires had been attached and tightened.  Impedence testing was satisfactory.  Electrical continuity was established.  The wound was irrigated with bacitracin  and closed with successive layers  of 0 Vicryl, 2-0 Vicryl and 3-0 nylon.  Betadine topical dressings were  applied at each site and the patient returned to the recovery room  in good  condition.           ______________________________  Payton Doughty, M.D.     MWR/MEDQ  D:  02/05/2006  T:  02/07/2006  Job:  [id number]

## 2011-02-20 NOTE — Op Note (Signed)
NAMEMARIGRACE, [name]                ACCOUNT NO.:  [id number]   MEDICAL RECORD NO.:  [id number]          PATIENT TYPE:  AMB   LOCATION:  DAY                           FACILITY:  [facility]   PHYSICIAN:  Lionel December, M.D.    DATE OF BIRTH:  April 04, 1956   DATE OF PROCEDURE:  05/10/2006  DATE OF DISCHARGE:                                 OPERATIVE REPORT   PROCEDURE:  Colonoscopy.   INDICATIONS:  [name] is a 55 year old Caucasian female whose last exam was  six years ago.  She had one polyp seen but this was hyperplastic .  However,  some of the polyps were coagulated; therefore, it was histology was not  available.  Family history is significant for colon carcinoma secondary in  second-degree relatives.  Her father has polyps.  She is, therefore,  returning for exam.  Positive for colon carcinoma in a paternal aunt at age  8 and her father has had polyps.   Procedure was reviewed with the patient and informed consent was obtained.   PREOPERATIVE MEDICATIONS:  Demerol 40 mg IV, Versed 8 mg IV.   FINDINGS:  The procedure was performed in the endoscopy suite.  The  patient's vital signs and O2 saturation were monitored during the procedure  and remained stable.  The patient was placed in the left lateral recumbent  position.  Rectal examination was performed.  N No abnormality noted on  external or digital exam.  Olympus video scope was placed in the rectum and  advanced under vision into the sigmoid colon and beyond.  Preparation was  excellent.  The scope was passed to the cecum which was identified by the  ileocecal valve and appendiceal orifice.  Short segment of terminal ileum  was also examined and was normal.  As the scope was withdrawn, colonic  mucosa was once again carefully examined and was normal throughout.  Rectal  mucosa similarly was normal.  Scope was retroflexed to examine the anorectal  junction.  There was a small hemorrhoids below the dentate line.  Endoscope  was  straightened and withdrawn.  The patient tolerated the procedure well.   FINAL DIAGNOSIS:  Normal colonoscopy except small external hemorrhoids.   RECOMMENDATIONS:  1.  She will receive her usual medications and diet.  2.  Yearly hemoccults.  3.  I believe she could wait 10 years before her exam.      Lionel December, M.D.  Electronically Signed     NR/MEDQ  D:  05/10/2006  T:  05/10/2006  Job:  [id number]   cc:   Brooke Rear. Sherwood Gambler, MD  Fax: [phone]

## 2011-02-20 NOTE — H&P (Signed)
NAME:  [name], [name]                          ACCOUNT NO.:  [id number]   MEDICAL RECORD NO.:  [id number]                   PATIENT TYPE:  INP   LOCATION:  3006                                 FACILITY:  [facility]   PHYSICIAN:  Payton Doughty, M.D.                   DATE OF BIRTH:  Mar 16, 1956   DATE OF ADMISSION:  02/07/2003  DATE OF DISCHARGE:                                HISTORY & PHYSICAL   ADMISSION DIAGNOSIS:  Intractable pain in her left leg.   SERVICE:  Neurosurgery.   HISTORY OF PRESENT ILLNESS:  This is a very nice now 55 year old right-  handed white lady who took a fall off a horse several years ago and injured  her 4-5 disc.  She was studied with MR that demonstrated foraminal disc at 4-  5.  She was operated on at 4-5 with a fusion and intraoperatively discovered  that she had a fracture and actual bone fragments that had lacerated the  nerve root.  After the fusion, her back pain is better but she still has  some intractable pain down her right leg and she is now admitted for  placement of a spinal cord stimulator.  She has documented solid fusion by  CT scan and also by injection.   PAST MEDICAL HISTORY:  This is unremarkable for significant diseases.   PAST SURGICAL HISTORY:  She had an anterior cervical in 1999, lumbar fusion  two years ago and hysterectomy in 1987.   MEDICATION:  Lorcet.   ALLERGIES:  No known drug allergies.   FAMILY HISTORY:  Mom is 66 and in good health.  Dad is 47 in mediocre health  with polyarteritis.   SOCIAL HISTORY:  She does not smoke or drink and is currently on disability.   REVIEW OF SYMPTOMS:  This is remarkable for back and leg pain.   PHYSICAL EXAMINATION:  HEENT:  Within normal limits.  NECK:  She has good range of motion in her neck.  CHEST:  Clear.  CARDIOVASCULAR:  Regular rate and rhythm.  ABDOMEN:  Nontender with no hepatosplenomegaly.  It is considerably smaller  than it use to be.  EXTREMITIES:  There is no  clubbing or cyanosis.  Peripheral pulses are good.  GU:  Examination is deferred.  NEUROLOGIC:  She is awake, alert and oriented.  Cranial nerves are intact.  Motor examination demonstrates 5/5 strength throughout the upper and lower  extremities save for hip pain, limited hip flexion and weakness on the right  side.  Sensory deficit described in right L5 distribution.  Reflexes are 2  at the knees, 1 at the ankles.  Straight leg raising is modestly positive.   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   PLAN:  Placement of a trial spinal cord stimulator.  If this is helpful, she  will have the permanent implant a week  from today.                                               Payton Doughty, M.D.    MWR/MEDQ  D:  02/07/2003  T:  02/08/2003  Job:  [id number]

## 2011-02-20 NOTE — Discharge Summary (Signed)
[place]  Patient:    [name], [name]                       MRN: [id number] Adm. Date:  [id number] Disc. Date: [id number] Attending:  Emeterio Reeve                           Discharge Summary  ADMITTING DIAGNOSIS:  Spondylosis of L4-L5.  DISCHARGE DIAGNOSIS:  Spondylosis of L4-L5.  PROCEDURE:  L4-L5 posterior lumbar antibody fusion using threaded ray cages.  COMPLICATIONS:  None.  HISTORY OF PRESENT ILLNESS:  [name] is a 55 year old woman who fell off her horse in June 2002, landing on her back.  The pain in her back has been unremitting.  She had a nerve block at L4-L5 which did relieve her pain.  She is therefore admitted for a posterior lumbar antibody fusion at L4-L5.  HOSPITAL COURSE:  The patient was admitted and taken to the operating room. She had an uncomplicated procedure on May 11, 2001.  Postoperatively, she has done quite well.  She has some back pain, but no neurologic deficits.  Her wound is clean and dry without signs of infection.  She also has had low blood pressure recordings, however, she remains low from the mid 80s to mid 90s/30s to 50s.  At discharge, she has 5/5 strength in the lower extremities.  DISCHARGE MEDICATION:  Percocet 10 mg one to two tablets q.6h. for pain.  ACTIVITY:  No bending, lifting, twisting or driving.  SPECIAL INSTRUCTIONS:  She was told to call us if temperature is greater than 101.5 or if there is any drainage from the wound. DD:  05/14/01 TD:  05/15/01 Job: [id number]

## 2011-02-20 NOTE — Op Note (Signed)
   NAME:  [name], [name]                          ACCOUNT NO.:  [id number]   MEDICAL RECORD NO.:  [id number]                   PATIENT TYPE:  INP   LOCATION:  3013                                 FACILITY:  [facility]   PHYSICIAN:  Payton Doughty, M.D.                   DATE OF BIRTH:  [birth year]   DATE OF PROCEDURE:  02/14/2003  DATE OF DISCHARGE:  02/14/2003                                 OPERATIVE REPORT   PREOPERATIVE DIAGNOSIS:  Implanted spinal cord stimulator.   POSTOPERATIVE DIAGNOSIS:  Implanted spinal cord stimulator.   OPERATION PERFORMED:  Final implantation of spinal cord stimulator receiver.   SURGEON:  Payton Doughty, M.D.   ASSISTANTBasilia Jumbo.   ANESTHESIA:  General endotracheal.   PREP:  Sterile Betadine prep and scrub with alcohol wipe.   COMPLICATIONS:  None.   INDICATIONS FOR PROCEDURE:  The patient is a 55 year old female with a  spinal cord implant a week ago.  She has received good coverage and is here  for final implantation.   DESCRIPTION OF PROCEDURE:  The patient was taken to the operating room,  smoothly anesthetized, intubated and placed  prone on the operating table.  Following shave, prep and drape in the usual sterile fashion, the old skin  incision was reopened and the wires entering the spinal cord stimulator were  mobilized.  The extension wires were removed and the extension wire cable  was divided and removed from the skin.  Subcutaneous tunnel was created over  the right posterior superior iliac spine and the cables tunneled there.  The  receiving device was placed and the cables connected to it and tightened  with the _______________  devices in place. The extra cable was coiled in  place in front of the receiver which was placed into the subcutaneous  pocket.  Both wounds were irrigated with kanamycin.  The fascia was  reapproximated with 0 Vicryl in interrupted fashion.  The subcutaneous  tissues were reapproximated with 0 Vicryl in  interrupted fashion.  Subcuticular tissues reapproximated with 3-0 Vicryl in interrupted fashion.  The  skin was closed with 3-0 nylon in running locked fashion.  Betadine  Telfa dressing was applied and made occlusive with Op-Site.  The patient was  returned to the recovery room in good condition.                                                Payton Doughty, M.D.    MWR/MEDQ  D:  02/14/2003  T:  02/15/2003  Job:  [id number]

## 2011-02-20 NOTE — Op Note (Signed)
[name], [name]                ACCOUNT NO.:  [id number]   MEDICAL RECORD NO.:  [id number]          PATIENT TYPE:  AMB   LOCATION:  SDS                          FACILITY:  [facility]   PHYSICIAN:  Payton Doughty, M.D.      DATE OF BIRTH:  Oct 02, 1956   DATE OF PROCEDURE:  07/31/2005  DATE OF DISCHARGE:  07/31/2005                                 OPERATIVE REPORT   PREOPERATIVE DIAGNOSIS:  Spinal cord stimulating receiver in place.   POSTOPERATIVE DIAGNOSIS:  Spinal cord stimulating receiver in place.   OPERATIVE PROCEDURE:  Replacement of spinal cord stimulating receiver with  rechargeable battery.   SURGEON:  Payton Doughty, M.D.   SERVICE:  Neurosurgery.   ANESTHESIA:  General endotracheal anesthesia.   PREPARATION:  Betadine and alcohol wipe.   COMPLICATIONS:  None.   ASSISTANT:  Nurse assistant [place]   BODY OF TEXT:  55 year old female with a spinal cord stimulator in place.  She has a receiver in, we are going to replace it with a battery.  She is  taken to the operating room, smoothly anesthetized, and intubated.  Placed  prone on the operating table.  Following shave, prep and drape in the usual  sterile fashion, the old skin incision was reopened over the receiver.  The  receiver was mobilized, the pocket expanded to accommodate the battery.  The  wires were detached from the receiver and placed into the battery.  The  torque wrench was used to tighten the receiving screws.  The impedance was  tested and found to be satisfactory.  The wounds were irrigated and  hemostasis assured.  The subcutaneous tissue and fascia were reapproximated  with 2-0 Vicryl and the skin was closed with 3-0 nylon in a running  subcuticular fashion.  Betadine and Telfa dressing were applied and the  patient returned to the recovery room in good condition.           ______________________________  Payton Doughty, M.D.     MWR/MEDQ  D:  07/31/2005  T:  07/31/2005  Job:  [id number]

## 2011-02-20 NOTE — H&P (Signed)
[place]  Patient:    [name], [name]                         MRN: [id number] Adm. Date:  05/11/01 Attending:  Payton Doughty, M.D.                         History and Physical  ADMITTING DIAGNOSES:  Spondylosis L4-L5.  HISTORY:  This is a 55 year old right-handed white lady who in March 1999 had an anterior cervicectomy fusion.  Did well and had some back pain on an intermittent basis.  In June she took a fall off a horse, landed on her back. Had some back pain, pain down her left with some right leg pain.  MRI was obtained that shows spondylitic disease at the L4-5.  She underwent a transforaminal injection with only transient help.  She is now admitted for a posterior lumbar interbody fusion at L4-5.  PAST MEDICAL HISTORY:  Unremarkable for significant disease.  She had a hysterectomy in 1987.  MEDICATIONS:  Lorcet.  SOCIAL HISTORY:  Does not smoke.  Does not drink.  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  FAMILY HISTORY:  Mom is 26, in good health.  Dad is 12, mediocre health with polyarteritis.  REVIEW OF SYSTEMS:  Remarkable for back and leg pain.  PHYSICAL EXAMINATION  HEENT:  Within normal limits.  NECK:  Good range of motion.  CHEST:  Clear.  CARDIAC:  Regular rate and rhythm.  ABDOMEN:  Nontender.  No hepatosplenomegaly.  EXTREMITIES:  No clubbing or cyanosis.  GENITOURINARY:  Deferred.  PERIPHERAL PULSES:  Good.  NEUROLOGIC:  She is awake, alert, and oriented.  Cranial nerves are intact. Motor examination shows 5/5 strength throughout the upper and lower extremities save for the hip flexors which ______ limiting slight dorsiflexion weakness on the right side.  She has a right L4-L5 sensory deficit.  Straight leg raise is positive on the right.  CLINICAL IMPRESSION:  Right L4-L5 radiculopathy secondary to severe spondylitis disease at 4-5.  PLAN:  Lumbar laminectomy/discectomy, posterior  lumbar interbody fusion at L4-5.  The risks and benefits have been discussed with her and she wished to proceed. DD:  05/11/01 TD:  05/11/01 Job: [id number]

## 2011-09-21 ENCOUNTER — Encounter (INDEPENDENT_AMBULATORY_CARE_PROVIDER_SITE_OTHER): Payer: Self-pay | Admitting: Internal Medicine

## 2011-09-21 ENCOUNTER — Ambulatory Visit (INDEPENDENT_AMBULATORY_CARE_PROVIDER_SITE_OTHER): Payer: 59 | Admitting: Internal Medicine

## 2011-09-21 ENCOUNTER — Other Ambulatory Visit (INDEPENDENT_AMBULATORY_CARE_PROVIDER_SITE_OTHER): Payer: Self-pay | Admitting: *Deleted

## 2011-09-21 ENCOUNTER — Encounter (INDEPENDENT_AMBULATORY_CARE_PROVIDER_SITE_OTHER): Payer: Self-pay | Admitting: *Deleted

## 2011-09-21 ENCOUNTER — Telehealth (INDEPENDENT_AMBULATORY_CARE_PROVIDER_SITE_OTHER): Payer: Self-pay | Admitting: *Deleted

## 2011-09-21 VITALS — BP 90/70 | HR 72 | Temp 97.9°F | Ht 68.0 in | Wt 215.9 lb

## 2011-09-21 DIAGNOSIS — M549 Dorsalgia, unspecified: Secondary | ICD-10-CM

## 2011-09-21 DIAGNOSIS — G8929 Other chronic pain: Secondary | ICD-10-CM | POA: Insufficient documentation

## 2011-09-21 DIAGNOSIS — R195 Other fecal abnormalities: Secondary | ICD-10-CM

## 2011-09-21 DIAGNOSIS — K625 Hemorrhage of anus and rectum: Secondary | ICD-10-CM

## 2011-09-21 MED ORDER — PEG-KCL-NACL-NASULF-NA ASC-C 100 G PO SOLR: 1.0000 | Freq: Once | ORAL | Status: DC

## 2011-09-21 NOTE — Progress Notes (Signed)
Subjective:     Patient ID: [name], female   DOB: 08/04/1956, 55 y.o.   MRN: [id number]  HPI  [name] is a 55 yr old female here today. She tells me that she had 2 stool cards that were positive for blood  1 1/2 weeks ago.  She denies seeing any blood. Appetite is good. No weight loss. She has actually gained weight. No abdominal pain.  She takes stool softner usually a daily basis.  She usually has a BM about once every two weeks.  She will have small BMs during the week.  She tells me her stools are very hard. Family hx of colon cancer in an Aunt at age 50.  Father had hx of colonic polyps.   Patient has a spinal cord stimulator  Last colonosocpy in August of 2007 FINAL DIAGNOSIS: Normal colonoscopy except small external hemorrhoids.  RECOMMENDATIONS:  1. She will receive her usual medications and diet.  2. Yearly hemoccults.  3. I believe she could wait 10 years before her exam.      Review of Systems see hpi Current Outpatient Prescriptions  Medication Sig Dispense Refill  . Casanthranol-Docusate Sodium 30-100 MG CAPS Take by mouth.        . celecoxib (CELEBREX) 200 MG capsule Take 200 mg by mouth 2 (two) times daily.        . cholecalciferol (VITAMIN D) 1000 UNITS tablet Take 1,000 Units by mouth 2 (two) times daily.        Marland Kitchen gabapentin (NEURONTIN) 300 MG capsule Take 300 mg by mouth 3 (three) times daily.        Marland Kitchen HYDROcodone-acetaminophen (NORCO) 10-325 MG per tablet Take 1 tablet by mouth every 6 (six) hours as needed.         Past Surgical History  Procedure Date  . 8 back surgeries   . Left foot surgery   . Partial hysterectomy    Past Medical History  Diagnosis Date  . Chronic back pain    Allergies not on file History   Social History  . Marital Status: Married    Spouse Name: N/A    Number of Children: N/A  . Years of Education: N/A   Occupational History  . Not on file.   Social History Main Topics  . Smoking status: Never Smoker   . Smokeless  tobacco: Not on file  . Alcohol Use: No  . Drug Use: No  . Sexually Active: Not on file   Other Topics Concern  . Not on file   Social History Narrative  . No narrative on file   Family Status  Relation Status Death Age  . Mother Alive     good health  . Father Deceased     poly arthritis nosda, prostate cancer, CAD  . Brother Alive     good health        Objective:   Physical Exam Filed Vitals:   09/21/11 1006  BP: 90/70  Pulse: 72  Temp: 97.9 F (36.6 C)  Height: 5\' 8"  (1.727 m)  Weight: 215 lb 14.4 oz (97.932 kg)    Alert and oriented. Skin warm and dry. Oral mucosa is moist. Natural teeth in good condition. Sclera anicteric, conjunctivae is pink. Thyroid not enlarged. No cervical lymphadenopathy. Lungs clear. Heart regular rate and rhythm.  Abdomen is soft. Bowel sounds are positive. No hepatomegaly. No abdominal masses felt. No tenderness. No stools and guaiac negative. No edema to lower extremities. Patient is alert  and oriented.     Assessment:   Two positive hemoccult cards.  Rectal carcinoma needs to be ruled out. Patient also has a hx of constipation.      Plan:   Colonoscopy. The risks and benefits such as perforation, bleeding, and infection were reviewed with the patient and is agreeable.

## 2011-09-21 NOTE — Patient Instructions (Signed)
Will schedule a colonoscopy with Dr. Rehman. The risks and benefits such as perforation, bleeding, and infection were reviewed with the patient and is agreeable.  

## 2011-09-21 NOTE — Telephone Encounter (Signed)
Patient needs movi prep 

## 2011-10-13 MED ORDER — SODIUM CHLORIDE 0.45 % IV SOLN
Freq: Once | INTRAVENOUS | Status: AC
  Administered 2011-10-14: 10:00:00 via INTRAVENOUS

## 2011-10-14 ENCOUNTER — Ambulatory Visit (HOSPITAL_COMMUNITY)
Admission: RE | Admit: 2011-10-14 | Discharge: 2011-10-14 | Disposition: A | Discharge status: Home / Self Care | Payer: 59 | Source: Ambulatory Visit | Attending: Internal Medicine | Admitting: Internal Medicine

## 2011-10-14 ENCOUNTER — Encounter (HOSPITAL_COMMUNITY)
Admission: RE | Disposition: A | Discharge status: Home / Self Care | Payer: Self-pay | Source: Ambulatory Visit | Attending: Internal Medicine

## 2011-10-14 ENCOUNTER — Other Ambulatory Visit (INDEPENDENT_AMBULATORY_CARE_PROVIDER_SITE_OTHER): Payer: Self-pay | Admitting: Internal Medicine

## 2011-10-14 ENCOUNTER — Encounter (HOSPITAL_COMMUNITY): Payer: Self-pay

## 2011-10-14 DIAGNOSIS — K921 Melena: Secondary | ICD-10-CM | POA: Insufficient documentation

## 2011-10-14 DIAGNOSIS — R195 Other fecal abnormalities: Secondary | ICD-10-CM | POA: Diagnosis not present

## 2011-10-14 DIAGNOSIS — D128 Benign neoplasm of rectum: Secondary | ICD-10-CM | POA: Insufficient documentation

## 2011-10-14 DIAGNOSIS — K644 Residual hemorrhoidal skin tags: Secondary | ICD-10-CM | POA: Insufficient documentation

## 2011-10-14 DIAGNOSIS — D129 Benign neoplasm of anus and anal canal: Secondary | ICD-10-CM | POA: Diagnosis not present

## 2011-10-14 DIAGNOSIS — D126 Benign neoplasm of colon, unspecified: Secondary | ICD-10-CM | POA: Diagnosis not present

## 2011-10-14 HISTORY — DX: Adverse effect of unspecified anesthetic, initial encounter: T41.45XA

## 2011-10-14 HISTORY — PX: COLONOSCOPY: SHX5424

## 2011-10-14 HISTORY — DX: Other complications of anesthesia, initial encounter: T88.59XA

## 2011-10-14 SURGERY — COLONOSCOPY
Anesthesia: Moderate Sedation

## 2011-10-14 MED ORDER — MIDAZOLAM HCL 5 MG/5ML IJ SOLN
INTRAMUSCULAR | Status: AC
  Filled 2011-10-14: qty 10

## 2011-10-14 MED ORDER — MIDAZOLAM HCL 5 MG/5ML IJ SOLN
INTRAMUSCULAR | Status: DC | PRN
  Administered 2011-10-14 (×3): 2 mg via INTRAVENOUS

## 2011-10-14 MED ORDER — MEPERIDINE HCL 50 MG/ML IJ SOLN
INTRAMUSCULAR | Status: DC | PRN
  Administered 2011-10-14 (×2): 25 mg via INTRAVENOUS

## 2011-10-14 MED ORDER — STERILE WATER FOR IRRIGATION IR SOLN
Status: DC | PRN
  Administered 2011-10-14: 11:00:00

## 2011-10-14 MED ORDER — MEPERIDINE HCL 50 MG/ML IJ SOLN
INTRAMUSCULAR | Status: AC
  Filled 2011-10-14: qty 1

## 2011-10-14 NOTE — H&P (Signed)
This is an update to history and physical from 09/21/2011. Is been no change in patient's symptoms or medications. She has not passed blood per rectum. She is on Celebrex 200 mg twice daily has not experience any epigastric pain or melena. Patient is undergoing diagnostic colonoscopy because of heme positive stools.

## 2011-10-14 NOTE — Op Note (Signed)
COLONOSCOPY PROCEDURE REPORT  PATIENT:  [name]  MR#:  [id number] Birthdate:  [birth year], 56 y.o., female Endoscopist:  Dr. Malissa Hippo, MD Referred By:  Dr. Madelin Rear. Sherwood Gambler, MD Procedure Date: 10/14/2011  Procedure:   Colonoscopy  Indications:  Patient is 56 year old Caucasian female who is here to have heme positive stools. She denies rectal bleeding melena change in her bowel habits. She is on NSAIDs chronically for back problems but has not had any artifacts her symptoms. Hemoglobin in November 2012 was close to 15 g. Patient,s  last colonoscopy was about 5-1/2 years ago.  Informed Consent:  Procedure and risks were reviewed with the patient and informed consent was obtained.  Medications:  Demerol 50 mg IV Versed 6 mg IV  Description of procedure:  After a digital rectal exam was performed, that colonoscope was advanced from the anus through the rectum and colon to the area of the cecum, ileocecal valve and appendiceal orifice. The cecum was deeply intubated. These structures were well-seen and photographed for the record. From the level of the cecum and ileocecal valve, the scope was slowly and cautiously withdrawn. The mucosal surfaces were carefully surveyed utilizing scope tip to flexion to facilitate fold flattening as needed. The scope was pulled down into the rectum where a thorough exam including retroflexion was performed. Terminal ileum was also examined..   Findings:   Prep excellent. Normal terminal ileum. 3 mm polyp ablated via cold biopsy from rectosigmoid junction. Rest of the colonic mucosa was normal Small hemorrhoids below the dentate line.  Therapeutic/Diagnostic Maneuvers Performed:  See above  Complications:  None   Cecal Withdrawal Time:  10 minutes  Impression:  Normal terminal ileum. Normal colonoscopy except small external hemorrhoids and a 3 mm polyp at rectosigmoid junction which was ablated via cold biopsy.  Recommendations:  Standard  instructions given. I will be contacting patient with results of biopsy. Patient advised to decrease Celebrex dose to 200 mg daily if at all possible.  Takima Encina U  10/14/2011 11:00 AM  CC: Dr. Cassell Smiles., MD, MD & Dr. Bonnetta Barry ref. provider found

## 2011-10-19 ENCOUNTER — Encounter (INDEPENDENT_AMBULATORY_CARE_PROVIDER_SITE_OTHER): Payer: Self-pay | Admitting: *Deleted

## 2011-10-19 ENCOUNTER — Encounter (HOSPITAL_COMMUNITY): Payer: Self-pay | Admitting: Internal Medicine

## 2011-10-30 DIAGNOSIS — M47817 Spondylosis without myelopathy or radiculopathy, lumbosacral region: Secondary | ICD-10-CM | POA: Diagnosis not present

## 2012-01-25 DIAGNOSIS — M25529 Pain in unspecified elbow: Secondary | ICD-10-CM | POA: Diagnosis not present

## 2012-02-02 ENCOUNTER — Encounter (INDEPENDENT_AMBULATORY_CARE_PROVIDER_SITE_OTHER): Payer: Self-pay

## 2012-02-08 DIAGNOSIS — M25529 Pain in unspecified elbow: Secondary | ICD-10-CM | POA: Diagnosis not present

## 2012-02-10 DIAGNOSIS — M47817 Spondylosis without myelopathy or radiculopathy, lumbosacral region: Secondary | ICD-10-CM | POA: Diagnosis not present

## 2012-02-11 DIAGNOSIS — M25529 Pain in unspecified elbow: Secondary | ICD-10-CM | POA: Diagnosis not present

## 2012-02-11 DIAGNOSIS — Z87828 Personal history of other (healed) physical injury and trauma: Secondary | ICD-10-CM | POA: Diagnosis not present

## 2012-02-15 DIAGNOSIS — M25529 Pain in unspecified elbow: Secondary | ICD-10-CM | POA: Diagnosis not present

## 2012-02-15 DIAGNOSIS — M25539 Pain in unspecified wrist: Secondary | ICD-10-CM | POA: Diagnosis not present

## 2012-02-16 DIAGNOSIS — M6281 Muscle weakness (generalized): Secondary | ICD-10-CM | POA: Diagnosis not present

## 2012-02-16 DIAGNOSIS — M79609 Pain in unspecified limb: Secondary | ICD-10-CM | POA: Diagnosis not present

## 2012-02-23 DIAGNOSIS — M79609 Pain in unspecified limb: Secondary | ICD-10-CM | POA: Diagnosis not present

## 2012-02-23 DIAGNOSIS — M6281 Muscle weakness (generalized): Secondary | ICD-10-CM | POA: Diagnosis not present

## 2012-02-24 DIAGNOSIS — M545 Low back pain, unspecified: Secondary | ICD-10-CM | POA: Diagnosis not present

## 2012-02-24 DIAGNOSIS — R079 Chest pain, unspecified: Secondary | ICD-10-CM | POA: Diagnosis not present

## 2012-02-24 DIAGNOSIS — M25529 Pain in unspecified elbow: Secondary | ICD-10-CM | POA: Diagnosis not present

## 2012-02-24 DIAGNOSIS — Z79899 Other long term (current) drug therapy: Secondary | ICD-10-CM | POA: Diagnosis not present

## 2012-03-02 DIAGNOSIS — M25539 Pain in unspecified wrist: Secondary | ICD-10-CM | POA: Diagnosis not present

## 2012-03-08 DIAGNOSIS — M6281 Muscle weakness (generalized): Secondary | ICD-10-CM | POA: Diagnosis not present

## 2012-03-08 DIAGNOSIS — M7710 Lateral epicondylitis, unspecified elbow: Secondary | ICD-10-CM | POA: Diagnosis not present

## 2012-03-08 DIAGNOSIS — M79609 Pain in unspecified limb: Secondary | ICD-10-CM | POA: Diagnosis not present

## 2012-03-10 DIAGNOSIS — M6281 Muscle weakness (generalized): Secondary | ICD-10-CM | POA: Diagnosis not present

## 2012-03-10 DIAGNOSIS — M79609 Pain in unspecified limb: Secondary | ICD-10-CM | POA: Diagnosis not present

## 2012-03-10 DIAGNOSIS — M7710 Lateral epicondylitis, unspecified elbow: Secondary | ICD-10-CM | POA: Diagnosis not present

## 2012-03-15 DIAGNOSIS — M6281 Muscle weakness (generalized): Secondary | ICD-10-CM | POA: Diagnosis not present

## 2012-03-15 DIAGNOSIS — M7710 Lateral epicondylitis, unspecified elbow: Secondary | ICD-10-CM | POA: Diagnosis not present

## 2012-03-15 DIAGNOSIS — M79609 Pain in unspecified limb: Secondary | ICD-10-CM | POA: Diagnosis not present

## 2012-03-17 DIAGNOSIS — M6281 Muscle weakness (generalized): Secondary | ICD-10-CM | POA: Diagnosis not present

## 2012-03-17 DIAGNOSIS — M79609 Pain in unspecified limb: Secondary | ICD-10-CM | POA: Diagnosis not present

## 2012-03-17 DIAGNOSIS — M7710 Lateral epicondylitis, unspecified elbow: Secondary | ICD-10-CM | POA: Diagnosis not present

## 2012-03-22 DIAGNOSIS — M7710 Lateral epicondylitis, unspecified elbow: Secondary | ICD-10-CM | POA: Diagnosis not present

## 2012-03-22 DIAGNOSIS — M79609 Pain in unspecified limb: Secondary | ICD-10-CM | POA: Diagnosis not present

## 2012-03-22 DIAGNOSIS — M6281 Muscle weakness (generalized): Secondary | ICD-10-CM | POA: Diagnosis not present

## 2012-03-23 DIAGNOSIS — M6281 Muscle weakness (generalized): Secondary | ICD-10-CM | POA: Diagnosis not present

## 2012-03-23 DIAGNOSIS — M79609 Pain in unspecified limb: Secondary | ICD-10-CM | POA: Diagnosis not present

## 2012-03-23 DIAGNOSIS — M7710 Lateral epicondylitis, unspecified elbow: Secondary | ICD-10-CM | POA: Diagnosis not present

## 2012-03-31 DIAGNOSIS — M6281 Muscle weakness (generalized): Secondary | ICD-10-CM | POA: Diagnosis not present

## 2012-03-31 DIAGNOSIS — M7710 Lateral epicondylitis, unspecified elbow: Secondary | ICD-10-CM | POA: Diagnosis not present

## 2012-03-31 DIAGNOSIS — M79609 Pain in unspecified limb: Secondary | ICD-10-CM | POA: Diagnosis not present

## 2012-05-17 DIAGNOSIS — M47817 Spondylosis without myelopathy or radiculopathy, lumbosacral region: Secondary | ICD-10-CM | POA: Diagnosis not present

## 2012-09-07 DIAGNOSIS — M47812 Spondylosis without myelopathy or radiculopathy, cervical region: Secondary | ICD-10-CM | POA: Diagnosis not present

## 2012-09-07 DIAGNOSIS — M259 Joint disorder, unspecified: Secondary | ICD-10-CM | POA: Diagnosis not present

## 2012-10-31 DIAGNOSIS — M67919 Unspecified disorder of synovium and tendon, unspecified shoulder: Secondary | ICD-10-CM | POA: Diagnosis not present

## 2012-11-01 ENCOUNTER — Other Ambulatory Visit: Payer: Self-pay | Admitting: Dermatology

## 2012-11-01 DIAGNOSIS — D485 Neoplasm of uncertain behavior of skin: Secondary | ICD-10-CM | POA: Diagnosis not present

## 2012-11-01 DIAGNOSIS — H01009 Unspecified blepharitis unspecified eye, unspecified eyelid: Secondary | ICD-10-CM | POA: Diagnosis not present

## 2012-11-18 DIAGNOSIS — M25519 Pain in unspecified shoulder: Secondary | ICD-10-CM | POA: Diagnosis not present

## 2012-12-15 DIAGNOSIS — M25519 Pain in unspecified shoulder: Secondary | ICD-10-CM | POA: Diagnosis not present

## 2012-12-15 DIAGNOSIS — M2420 Disorder of ligament, unspecified site: Secondary | ICD-10-CM | POA: Diagnosis not present

## 2012-12-15 DIAGNOSIS — M47817 Spondylosis without myelopathy or radiculopathy, lumbosacral region: Secondary | ICD-10-CM | POA: Diagnosis not present

## 2012-12-15 DIAGNOSIS — M259 Joint disorder, unspecified: Secondary | ICD-10-CM | POA: Diagnosis not present

## 2012-12-16 ENCOUNTER — Other Ambulatory Visit: Payer: Self-pay | Admitting: Orthopedic Surgery

## 2012-12-16 DIAGNOSIS — M25511 Pain in right shoulder: Secondary | ICD-10-CM

## 2012-12-21 ENCOUNTER — Other Ambulatory Visit: Payer: 59

## 2012-12-26 DIAGNOSIS — M25519 Pain in unspecified shoulder: Secondary | ICD-10-CM | POA: Diagnosis not present

## 2012-12-27 DIAGNOSIS — M25519 Pain in unspecified shoulder: Secondary | ICD-10-CM | POA: Diagnosis not present

## 2012-12-30 DIAGNOSIS — M25519 Pain in unspecified shoulder: Secondary | ICD-10-CM | POA: Diagnosis not present

## 2013-01-02 DIAGNOSIS — M25519 Pain in unspecified shoulder: Secondary | ICD-10-CM | POA: Diagnosis not present

## 2013-01-04 DIAGNOSIS — M25519 Pain in unspecified shoulder: Secondary | ICD-10-CM | POA: Diagnosis not present

## 2013-01-06 ENCOUNTER — Ambulatory Visit
Admission: RE | Admit: 2013-01-06 | Discharge: 2013-01-06 | Disposition: A | Discharge status: Home / Self Care | Payer: 59 | Source: Ambulatory Visit | Attending: Orthopedic Surgery | Admitting: Orthopedic Surgery

## 2013-01-06 DIAGNOSIS — M25511 Pain in right shoulder: Secondary | ICD-10-CM

## 2013-01-06 MED ORDER — IOHEXOL 300 MG/ML  SOLN
15.0000 mL | Freq: Once | INTRAMUSCULAR | Status: AC | PRN
  Administered 2013-01-06: 15 mL via INTRA_ARTICULAR

## 2013-01-09 DIAGNOSIS — M25519 Pain in unspecified shoulder: Secondary | ICD-10-CM | POA: Diagnosis not present

## 2013-01-11 DIAGNOSIS — M25519 Pain in unspecified shoulder: Secondary | ICD-10-CM | POA: Diagnosis not present

## 2013-01-12 DIAGNOSIS — M25519 Pain in unspecified shoulder: Secondary | ICD-10-CM | POA: Diagnosis not present

## 2013-01-16 DIAGNOSIS — M25519 Pain in unspecified shoulder: Secondary | ICD-10-CM | POA: Diagnosis not present

## 2013-01-18 DIAGNOSIS — M25519 Pain in unspecified shoulder: Secondary | ICD-10-CM | POA: Diagnosis not present

## 2013-01-19 DIAGNOSIS — M25519 Pain in unspecified shoulder: Secondary | ICD-10-CM | POA: Diagnosis not present

## 2013-01-23 DIAGNOSIS — M25819 Other specified joint disorders, unspecified shoulder: Secondary | ICD-10-CM | POA: Diagnosis not present

## 2013-01-23 DIAGNOSIS — M25519 Pain in unspecified shoulder: Secondary | ICD-10-CM | POA: Diagnosis not present

## 2013-01-26 DIAGNOSIS — M25519 Pain in unspecified shoulder: Secondary | ICD-10-CM | POA: Diagnosis not present

## 2013-01-30 DIAGNOSIS — M25519 Pain in unspecified shoulder: Secondary | ICD-10-CM | POA: Diagnosis not present

## 2013-04-03 DIAGNOSIS — M47817 Spondylosis without myelopathy or radiculopathy, lumbosacral region: Secondary | ICD-10-CM | POA: Diagnosis not present

## 2013-04-03 DIAGNOSIS — M259 Joint disorder, unspecified: Secondary | ICD-10-CM | POA: Diagnosis not present

## 2013-04-03 DIAGNOSIS — M47812 Spondylosis without myelopathy or radiculopathy, cervical region: Secondary | ICD-10-CM | POA: Diagnosis not present

## 2013-04-21 DIAGNOSIS — D235 Other benign neoplasm of skin of trunk: Secondary | ICD-10-CM | POA: Diagnosis not present

## 2013-04-21 DIAGNOSIS — L719 Rosacea, unspecified: Secondary | ICD-10-CM | POA: Diagnosis not present

## 2013-04-27 DIAGNOSIS — M7500 Adhesive capsulitis of unspecified shoulder: Secondary | ICD-10-CM | POA: Diagnosis not present

## 2013-04-27 DIAGNOSIS — M24119 Other articular cartilage disorders, unspecified shoulder: Secondary | ICD-10-CM | POA: Diagnosis not present

## 2013-04-27 DIAGNOSIS — M25819 Other specified joint disorders, unspecified shoulder: Secondary | ICD-10-CM | POA: Diagnosis not present

## 2013-04-27 DIAGNOSIS — G8918 Other acute postprocedural pain: Secondary | ICD-10-CM | POA: Diagnosis not present

## 2013-06-06 DIAGNOSIS — M25519 Pain in unspecified shoulder: Secondary | ICD-10-CM | POA: Diagnosis not present

## 2013-06-12 DIAGNOSIS — M25519 Pain in unspecified shoulder: Secondary | ICD-10-CM | POA: Diagnosis not present

## 2013-06-15 DIAGNOSIS — M25519 Pain in unspecified shoulder: Secondary | ICD-10-CM | POA: Diagnosis not present

## 2013-06-20 DIAGNOSIS — M25519 Pain in unspecified shoulder: Secondary | ICD-10-CM | POA: Diagnosis not present

## 2013-07-17 DIAGNOSIS — M25519 Pain in unspecified shoulder: Secondary | ICD-10-CM | POA: Diagnosis not present

## 2013-07-20 DIAGNOSIS — M25519 Pain in unspecified shoulder: Secondary | ICD-10-CM | POA: Diagnosis not present

## 2013-07-24 DIAGNOSIS — Z6827 Body mass index (BMI) 27.0-27.9, adult: Secondary | ICD-10-CM | POA: Diagnosis not present

## 2013-07-24 DIAGNOSIS — IMO0001 Reserved for inherently not codable concepts without codable children: Secondary | ICD-10-CM | POA: Diagnosis not present

## 2013-07-24 DIAGNOSIS — Z23 Encounter for immunization: Secondary | ICD-10-CM | POA: Diagnosis not present

## 2013-08-07 DIAGNOSIS — L259 Unspecified contact dermatitis, unspecified cause: Secondary | ICD-10-CM | POA: Diagnosis not present

## 2013-09-07 DIAGNOSIS — Z01419 Encounter for gynecological examination (general) (routine) without abnormal findings: Secondary | ICD-10-CM | POA: Diagnosis not present

## 2013-09-07 DIAGNOSIS — Z1231 Encounter for screening mammogram for malignant neoplasm of breast: Secondary | ICD-10-CM | POA: Diagnosis not present

## 2013-09-07 DIAGNOSIS — Z1212 Encounter for screening for malignant neoplasm of rectum: Secondary | ICD-10-CM | POA: Diagnosis not present

## 2013-10-24 DIAGNOSIS — N3942 Incontinence without sensory awareness: Secondary | ICD-10-CM | POA: Diagnosis not present

## 2013-10-24 DIAGNOSIS — R351 Nocturia: Secondary | ICD-10-CM | POA: Diagnosis not present

## 2013-10-30 DIAGNOSIS — N3942 Incontinence without sensory awareness: Secondary | ICD-10-CM | POA: Diagnosis not present

## 2013-10-30 DIAGNOSIS — N393 Stress incontinence (female) (male): Secondary | ICD-10-CM | POA: Diagnosis not present

## 2013-11-10 DIAGNOSIS — Z6826 Body mass index (BMI) 26.0-26.9, adult: Secondary | ICD-10-CM | POA: Diagnosis not present

## 2013-11-10 DIAGNOSIS — Z23 Encounter for immunization: Secondary | ICD-10-CM | POA: Diagnosis not present

## 2013-11-10 DIAGNOSIS — I7300 Raynaud's syndrome without gangrene: Secondary | ICD-10-CM | POA: Diagnosis not present

## 2013-11-13 DIAGNOSIS — M6281 Muscle weakness (generalized): Secondary | ICD-10-CM | POA: Diagnosis not present

## 2013-11-13 DIAGNOSIS — N393 Stress incontinence (female) (male): Secondary | ICD-10-CM | POA: Diagnosis not present

## 2013-11-13 DIAGNOSIS — N3942 Incontinence without sensory awareness: Secondary | ICD-10-CM | POA: Diagnosis not present

## 2013-11-14 DIAGNOSIS — M47812 Spondylosis without myelopathy or radiculopathy, cervical region: Secondary | ICD-10-CM | POA: Diagnosis not present

## 2013-11-14 DIAGNOSIS — M47817 Spondylosis without myelopathy or radiculopathy, lumbosacral region: Secondary | ICD-10-CM | POA: Diagnosis not present

## 2013-11-14 DIAGNOSIS — M2420 Disorder of ligament, unspecified site: Secondary | ICD-10-CM | POA: Diagnosis not present

## 2013-11-28 DIAGNOSIS — N393 Stress incontinence (female) (male): Secondary | ICD-10-CM | POA: Diagnosis not present

## 2013-11-28 DIAGNOSIS — M6281 Muscle weakness (generalized): Secondary | ICD-10-CM | POA: Diagnosis not present

## 2013-11-28 DIAGNOSIS — N3942 Incontinence without sensory awareness: Secondary | ICD-10-CM | POA: Diagnosis not present

## 2013-12-05 DIAGNOSIS — N393 Stress incontinence (female) (male): Secondary | ICD-10-CM | POA: Diagnosis not present

## 2013-12-05 DIAGNOSIS — N3942 Incontinence without sensory awareness: Secondary | ICD-10-CM | POA: Diagnosis not present

## 2013-12-05 DIAGNOSIS — M6281 Muscle weakness (generalized): Secondary | ICD-10-CM | POA: Diagnosis not present

## 2013-12-05 DIAGNOSIS — R351 Nocturia: Secondary | ICD-10-CM | POA: Diagnosis not present

## 2013-12-12 DIAGNOSIS — M6281 Muscle weakness (generalized): Secondary | ICD-10-CM | POA: Diagnosis not present

## 2013-12-12 DIAGNOSIS — N393 Stress incontinence (female) (male): Secondary | ICD-10-CM | POA: Diagnosis not present

## 2013-12-12 DIAGNOSIS — N3942 Incontinence without sensory awareness: Secondary | ICD-10-CM | POA: Diagnosis not present

## 2013-12-12 DIAGNOSIS — R351 Nocturia: Secondary | ICD-10-CM | POA: Diagnosis not present

## 2014-01-02 DIAGNOSIS — M6281 Muscle weakness (generalized): Secondary | ICD-10-CM | POA: Diagnosis not present

## 2014-01-02 DIAGNOSIS — N393 Stress incontinence (female) (male): Secondary | ICD-10-CM | POA: Diagnosis not present

## 2014-01-02 DIAGNOSIS — N3942 Incontinence without sensory awareness: Secondary | ICD-10-CM | POA: Diagnosis not present

## 2014-01-02 DIAGNOSIS — R351 Nocturia: Secondary | ICD-10-CM | POA: Diagnosis not present

## 2014-01-03 DIAGNOSIS — N393 Stress incontinence (female) (male): Secondary | ICD-10-CM | POA: Diagnosis not present

## 2014-01-03 DIAGNOSIS — N3942 Incontinence without sensory awareness: Secondary | ICD-10-CM | POA: Diagnosis not present

## 2014-01-23 DIAGNOSIS — N393 Stress incontinence (female) (male): Secondary | ICD-10-CM | POA: Diagnosis not present

## 2014-01-23 DIAGNOSIS — M6281 Muscle weakness (generalized): Secondary | ICD-10-CM | POA: Diagnosis not present

## 2014-01-23 DIAGNOSIS — N3942 Incontinence without sensory awareness: Secondary | ICD-10-CM | POA: Diagnosis not present

## 2014-01-23 DIAGNOSIS — R351 Nocturia: Secondary | ICD-10-CM | POA: Diagnosis not present

## 2014-02-14 DIAGNOSIS — R351 Nocturia: Secondary | ICD-10-CM | POA: Diagnosis not present

## 2014-02-14 DIAGNOSIS — N3942 Incontinence without sensory awareness: Secondary | ICD-10-CM | POA: Diagnosis not present

## 2014-02-14 DIAGNOSIS — M6281 Muscle weakness (generalized): Secondary | ICD-10-CM | POA: Diagnosis not present

## 2014-02-14 DIAGNOSIS — N393 Stress incontinence (female) (male): Secondary | ICD-10-CM | POA: Diagnosis not present

## 2014-02-22 DIAGNOSIS — M259 Joint disorder, unspecified: Secondary | ICD-10-CM | POA: Diagnosis not present

## 2014-02-22 DIAGNOSIS — M2420 Disorder of ligament, unspecified site: Secondary | ICD-10-CM | POA: Diagnosis not present

## 2014-02-22 DIAGNOSIS — M47812 Spondylosis without myelopathy or radiculopathy, cervical region: Secondary | ICD-10-CM | POA: Diagnosis not present

## 2014-02-22 DIAGNOSIS — M47817 Spondylosis without myelopathy or radiculopathy, lumbosacral region: Secondary | ICD-10-CM | POA: Diagnosis not present

## 2014-03-05 DIAGNOSIS — N393 Stress incontinence (female) (male): Secondary | ICD-10-CM | POA: Diagnosis not present

## 2014-03-14 DIAGNOSIS — R351 Nocturia: Secondary | ICD-10-CM | POA: Diagnosis not present

## 2014-03-14 DIAGNOSIS — N3942 Incontinence without sensory awareness: Secondary | ICD-10-CM | POA: Diagnosis not present

## 2014-03-14 DIAGNOSIS — M6281 Muscle weakness (generalized): Secondary | ICD-10-CM | POA: Diagnosis not present

## 2014-03-14 DIAGNOSIS — N393 Stress incontinence (female) (male): Secondary | ICD-10-CM | POA: Diagnosis not present

## 2014-04-11 DIAGNOSIS — Z23 Encounter for immunization: Secondary | ICD-10-CM | POA: Diagnosis not present

## 2014-06-06 DIAGNOSIS — M47817 Spondylosis without myelopathy or radiculopathy, lumbosacral region: Secondary | ICD-10-CM | POA: Diagnosis not present

## 2014-07-06 DIAGNOSIS — N393 Stress incontinence (female) (male): Secondary | ICD-10-CM | POA: Diagnosis not present

## 2014-08-16 DIAGNOSIS — H40003 Preglaucoma, unspecified, bilateral: Secondary | ICD-10-CM | POA: Diagnosis not present

## 2014-09-17 ENCOUNTER — Other Ambulatory Visit: Payer: Self-pay | Admitting: Obstetrics & Gynecology

## 2014-09-19 LAB — CYTOLOGY - PAP

## 2014-10-09 DIAGNOSIS — M24251 Disorder of ligament, right hip: Secondary | ICD-10-CM | POA: Diagnosis not present

## 2014-10-09 DIAGNOSIS — M47816 Spondylosis without myelopathy or radiculopathy, lumbar region: Secondary | ICD-10-CM | POA: Diagnosis not present

## 2014-10-18 DIAGNOSIS — M818 Other osteoporosis without current pathological fracture: Secondary | ICD-10-CM | POA: Diagnosis not present

## 2014-10-18 DIAGNOSIS — R29890 Loss of height: Secondary | ICD-10-CM | POA: Diagnosis not present

## 2014-10-18 DIAGNOSIS — N958 Other specified menopausal and perimenopausal disorders: Secondary | ICD-10-CM | POA: Diagnosis not present

## 2014-10-18 DIAGNOSIS — N951 Menopausal and female climacteric states: Secondary | ICD-10-CM | POA: Diagnosis not present

## 2014-10-23 DIAGNOSIS — G894 Chronic pain syndrome: Secondary | ICD-10-CM | POA: Diagnosis not present

## 2014-10-23 DIAGNOSIS — Z6824 Body mass index (BMI) 24.0-24.9, adult: Secondary | ICD-10-CM | POA: Diagnosis not present

## 2014-10-23 DIAGNOSIS — R351 Nocturia: Secondary | ICD-10-CM | POA: Diagnosis not present

## 2014-10-23 DIAGNOSIS — Z0000 Encounter for general adult medical examination without abnormal findings: Secondary | ICD-10-CM | POA: Diagnosis not present

## 2015-02-07 DIAGNOSIS — L259 Unspecified contact dermatitis, unspecified cause: Secondary | ICD-10-CM | POA: Diagnosis not present

## 2015-03-18 DIAGNOSIS — B379 Candidiasis, unspecified: Secondary | ICD-10-CM | POA: Diagnosis not present

## 2015-03-20 DIAGNOSIS — M4302 Spondylolysis, cervical region: Secondary | ICD-10-CM | POA: Diagnosis not present

## 2015-03-20 DIAGNOSIS — M24251 Disorder of ligament, right hip: Secondary | ICD-10-CM | POA: Diagnosis not present

## 2015-03-20 DIAGNOSIS — M259 Joint disorder, unspecified: Secondary | ICD-10-CM | POA: Diagnosis not present

## 2015-06-20 DIAGNOSIS — M4302 Spondylolysis, cervical region: Secondary | ICD-10-CM | POA: Diagnosis not present

## 2015-06-20 DIAGNOSIS — M47816 Spondylosis without myelopathy or radiculopathy, lumbar region: Secondary | ICD-10-CM | POA: Diagnosis not present

## 2015-06-20 DIAGNOSIS — M24251 Disorder of ligament, right hip: Secondary | ICD-10-CM | POA: Diagnosis not present

## 2015-07-09 DIAGNOSIS — N393 Stress incontinence (female) (male): Secondary | ICD-10-CM | POA: Diagnosis not present

## 2015-07-09 DIAGNOSIS — N3942 Incontinence without sensory awareness: Secondary | ICD-10-CM | POA: Diagnosis not present

## 2015-09-26 DIAGNOSIS — M259 Joint disorder, unspecified: Secondary | ICD-10-CM | POA: Diagnosis not present

## 2015-09-26 DIAGNOSIS — M24251 Disorder of ligament, right hip: Secondary | ICD-10-CM | POA: Diagnosis not present

## 2015-09-26 DIAGNOSIS — M47816 Spondylosis without myelopathy or radiculopathy, lumbar region: Secondary | ICD-10-CM | POA: Diagnosis not present

## 2015-09-26 DIAGNOSIS — M4302 Spondylolysis, cervical region: Secondary | ICD-10-CM | POA: Diagnosis not present

## 2015-10-02 DIAGNOSIS — H40053 Ocular hypertension, bilateral: Secondary | ICD-10-CM | POA: Diagnosis not present

## 2015-10-02 DIAGNOSIS — H40003 Preglaucoma, unspecified, bilateral: Secondary | ICD-10-CM | POA: Diagnosis not present

## 2015-11-11 DIAGNOSIS — Z1389 Encounter for screening for other disorder: Secondary | ICD-10-CM | POA: Diagnosis not present

## 2015-11-11 DIAGNOSIS — Z0000 Encounter for general adult medical examination without abnormal findings: Secondary | ICD-10-CM | POA: Diagnosis not present

## 2015-11-11 DIAGNOSIS — D696 Thrombocytopenia, unspecified: Secondary | ICD-10-CM | POA: Diagnosis not present

## 2015-11-11 DIAGNOSIS — Z6823 Body mass index (BMI) 23.0-23.9, adult: Secondary | ICD-10-CM | POA: Diagnosis not present

## 2015-11-16 ENCOUNTER — Emergency Department (HOSPITAL_COMMUNITY)
Admission: EM | Admit: 2015-11-16 | Discharge: 2015-11-17 | Disposition: A | Discharge status: Home / Self Care | Payer: 59 | Attending: Emergency Medicine | Admitting: Emergency Medicine

## 2015-11-16 ENCOUNTER — Encounter (HOSPITAL_COMMUNITY): Payer: Self-pay

## 2015-11-16 DIAGNOSIS — R42 Dizziness and giddiness: Secondary | ICD-10-CM | POA: Insufficient documentation

## 2015-11-16 DIAGNOSIS — Z79899 Other long term (current) drug therapy: Secondary | ICD-10-CM | POA: Diagnosis not present

## 2015-11-16 DIAGNOSIS — G8929 Other chronic pain: Secondary | ICD-10-CM | POA: Insufficient documentation

## 2015-11-16 DIAGNOSIS — R51 Headache: Secondary | ICD-10-CM | POA: Diagnosis not present

## 2015-11-16 DIAGNOSIS — Z791 Long term (current) use of non-steroidal anti-inflammatories (NSAID): Secondary | ICD-10-CM | POA: Insufficient documentation

## 2015-11-16 DIAGNOSIS — Z7982 Long term (current) use of aspirin: Secondary | ICD-10-CM | POA: Diagnosis not present

## 2015-11-16 LAB — CBC WITH DIFFERENTIAL/PLATELET
Basophils Absolute: 0.1 10*3/uL (ref 0.0–0.1)
Basophils Relative: 2 %
Eosinophils Absolute: 0.2 10*3/uL (ref 0.0–0.7)
Eosinophils Relative: 3 %
HCT: 40.4 % (ref 36.0–46.0)
Hemoglobin: 13.3 g/dL (ref 12.0–15.0)
Lymphocytes Relative: 32 %
Lymphs Abs: 2.1 10*3/uL (ref 0.7–4.0)
MCH: 29.8 pg (ref 26.0–34.0)
MCHC: 32.9 g/dL (ref 30.0–36.0)
MCV: 90.4 fL (ref 78.0–100.0)
Monocytes Absolute: 0.5 10*3/uL (ref 0.1–1.0)
Monocytes Relative: 8 %
Neutro Abs: 3.6 10*3/uL (ref 1.7–7.7)
Neutrophils Relative %: 55 %
Platelets: 161 10*3/uL (ref 150–400)
RBC: 4.47 MIL/uL (ref 3.87–5.11)
RDW: 13.2 % (ref 11.5–15.5)
WBC: 6.4 10*3/uL (ref 4.0–10.5)

## 2015-11-16 LAB — BASIC METABOLIC PANEL
Anion gap: 8 (ref 5–15)
BUN: 19 mg/dL (ref 6–20)
CO2: 30 mmol/L (ref 22–32)
Calcium: 9.3 mg/dL (ref 8.9–10.3)
Chloride: 102 mmol/L (ref 101–111)
Creatinine, Ser: 0.72 mg/dL (ref 0.44–1.00)
GFR calc Af Amer: >60 mL/min (ref >60)
GFR calc non Af Amer: >60 mL/min (ref >60)
Glucose, Bld: 95 mg/dL (ref 65–99)
Potassium: 4.2 mmol/L (ref 3.5–5.1)
Sodium: 140 mmol/L (ref 135–145)

## 2015-11-16 MED ORDER — SODIUM CHLORIDE 0.9 % IV BOLUS (SEPSIS)
1000.0000 mL | Freq: Once | INTRAVENOUS | Status: AC
  Administered 2015-11-16: 1000 mL via INTRAVENOUS

## 2015-11-16 NOTE — Discharge Instructions (Signed)

## 2015-11-16 NOTE — ED Notes (Signed)
I went to see my doctor on Monday for a wellness check and my blood pressure was 92 systolic. Yesterday and today it has been in the 80 s systolic. Yesterday I was dizzy, and I ate some chips at lunch and it became better. Today I am feeling bad.

## 2015-11-16 NOTE — ED Provider Notes (Signed)
CSN: [id number]     Arrival date & time 11/16/15  1957 History  By signing my name below, I, [name], attest that this documentation has been prepared under the direction and in the presence of Virgel Manifold, MD . Electronically Signed: Dora Barnes, Scribe. 11/16/2015. 9:28 PM.    Chief Complaint  Patient presents with  . Hypotension     The history is provided by the patient. No language interpreter was used.     HPI Comments: [name] is a 60 y.o. female with h/o chronic back pain who presents to the Emergency Department complaining of sudden onset hypotension with associated dizziness since yesterday. She endorses an associated headache as well and states that she currently feels bad. She states that she was holding her granddaughter yesterday while standing and suddenly felt lightheaded, which persisted after she sat down. Pt reports that her dizziness subsided after she ate salted chips. Pt denies that a change in position has caused or currently causes dizziness. She reports that she measured her own blood pressure yesterday and it was in the 80's. Pt states that she went to a doctor five days ago for a wellness check-up and her BP was measured in the low 90's, but states that she felt fine at the time. Pt reports that her blood pressure is usually between 98-120. She denies taking medication for low blood pressure. She has no h/o anemia. She denies SOB, appetite change, swelling, fatigue, or any other associated symptoms at this time. She has PSHx of back surgeries x8, left foot surgery, partial hysterectomy, and colonoscopy.   Past Medical History  Diagnosis Date  . Chronic back pain   . Complication of anesthesia     shaking and chattery   Past Surgical History  Procedure Laterality Date  . 8 back surgeries    . Left foot surgery    . Partial hysterectomy    . Colonoscopy  10/14/2011    Procedure: COLONOSCOPY;  Surgeon: Rogene Houston, MD;  Location: [facility];   Service: Endoscopy;  Laterality: N/A;  1030   No family history on file. Social History  Substance Use Topics  . Smoking status: Never Smoker   . Smokeless tobacco: None  . Alcohol Use: No   OB History    No data available     Review of Systems  Constitutional: Negative for fever, appetite change and fatigue.  Respiratory: Negative for shortness of breath.   Cardiovascular: Negative for leg swelling.  Neurological: Positive for dizziness and headaches.  All other systems reviewed and are negative.     Allergies  Review of patient's allergies indicates no known allergies.  Home Medications   Prior to Admission medications   Medication Sig Start Date End Date Taking? Authorizing Provider  aspirin EC 81 MG tablet Take 81 mg by mouth daily.   Yes Historical Provider, MD  CALCIUM PO Take 1 tablet by mouth daily.   Yes Historical Provider, MD  celecoxib (CELEBREX) 200 MG capsule Take 200 mg by mouth 2 (two) times daily.     Yes Historical Provider, MD  cholecalciferol (VITAMIN D) 1000 UNITS tablet Take 1,000 Units by mouth 2 (two) times daily.     Yes Historical Provider, MD  docusate sodium (COLACE) 100 MG capsule Take 100-200 mg by mouth 2 (two) times daily. Takes 100 mg in the morning and 200 mg in the evening.   Yes Historical Provider, MD  HYDROcodone-acetaminophen (NORCO) 10-325 MG tablet Take 1 tablet  by mouth every 6 (six) hours as needed for moderate pain.   Yes Historical Provider, MD  MINIVELLE 0.1 MG/24HR patch Place 1 patch onto the skin 2 (two) times a week. 10/16/15   Historical Provider, MD  vitamin C (ASCORBIC ACID) 500 MG tablet Take 500 mg by mouth daily.   Yes Historical Provider, MD   BP 130/69 mmHg  Pulse 61  Temp(Src) 98.1 F (36.7 C)  Resp 20  Ht 5\' 8"  (1.727 m)  Wt 156 lb (70.761 kg)  BMI 23.73 kg/m2  SpO2 100% Physical Exam  Constitutional: She appears well-developed and well-nourished. No distress.  HENT:  Head: Normocephalic and atraumatic.   Right Ear: External ear normal.  Left Ear: External ear normal.  Eyes: Conjunctivae are normal. Right eye exhibits no discharge. Left eye exhibits no discharge. No scleral icterus.  Neck: Neck supple. No tracheal deviation present.  Cardiovascular: Normal rate, regular rhythm, normal heart sounds and intact distal pulses.   Pulmonary/Chest: Effort normal and breath sounds normal. No stridor. No respiratory distress. She has no wheezes. She has no rales.  Abdominal: Soft. Bowel sounds are normal. She exhibits no distension. There is no tenderness. There is no rebound and no guarding.  Musculoskeletal: She exhibits no edema or tenderness.  Neurological: She is alert. She has normal strength. No cranial nerve deficit (no facial droop, extraocular movements intact, no slurred speech) or sensory deficit. She exhibits normal muscle tone. She displays no seizure activity. Coordination normal.  Skin: Skin is warm and dry. No rash noted.  Psychiatric: She has a normal mood and affect.  Nursing note and vitals reviewed.   ED Course  Procedures (including critical care time)  DIAGNOSTIC STUDIES: Oxygen Saturation is 100% on RA, normal by my interpretation.    COORDINATION OF CARE:  9:28 PM Will order CBC with differential and BMP. Will administer fluids. Discussed treatment plan with pt at bedside and pt agreed to plan.   Labs Review Labs Reviewed  CBC WITH DIFFERENTIAL/PLATELET  BASIC METABOLIC PANEL    Imaging Review No results found. I have personally reviewed and evaluated these lab results as part of my medical decision-making.   EKG Interpretation   Date/Time:  Saturday November 16 2015 22:40:50 EST Ventricular Rate:  51 PR Interval:  158 QRS Duration: 101 QT Interval:  453 QTC Calculation: 417 R Axis:   43 Text Interpretation:  Sinus rhythm Non-specific ST-t changes No  significant change since last tracing Confirmed by Ashby Leflore  MD, Chidera Dearcos  ([id number]) on 11/16/2015 11:12:59  PM      MDM   Final diagnoses:  Dizziness    59yF with dizziness which has since resolved. Historically, her BP seems to run on lower side. Not convinced her current symptoms because of this. She is very well appearing. HD stable in ED. Nonfocal exam. Normal w/u. Low suspicion for emergent process.    Virgel Manifold, MD 11/23/15 1534

## 2015-11-18 DIAGNOSIS — Z1389 Encounter for screening for other disorder: Secondary | ICD-10-CM | POA: Diagnosis not present

## 2015-11-18 DIAGNOSIS — I959 Hypotension, unspecified: Secondary | ICD-10-CM | POA: Diagnosis not present

## 2015-11-18 DIAGNOSIS — Z6823 Body mass index (BMI) 23.0-23.9, adult: Secondary | ICD-10-CM | POA: Diagnosis not present

## 2015-11-25 DIAGNOSIS — R002 Palpitations: Secondary | ICD-10-CM | POA: Diagnosis not present

## 2015-11-25 DIAGNOSIS — R55 Syncope and collapse: Secondary | ICD-10-CM | POA: Diagnosis not present

## 2015-11-25 DIAGNOSIS — D696 Thrombocytopenia, unspecified: Secondary | ICD-10-CM | POA: Diagnosis not present

## 2015-11-25 DIAGNOSIS — Z6823 Body mass index (BMI) 23.0-23.9, adult: Secondary | ICD-10-CM | POA: Diagnosis not present

## 2015-11-25 DIAGNOSIS — Z1389 Encounter for screening for other disorder: Secondary | ICD-10-CM | POA: Diagnosis not present

## 2015-12-04 DIAGNOSIS — Z1389 Encounter for screening for other disorder: Secondary | ICD-10-CM | POA: Diagnosis not present

## 2015-12-04 DIAGNOSIS — I950 Idiopathic hypotension: Secondary | ICD-10-CM | POA: Diagnosis not present

## 2015-12-04 DIAGNOSIS — R002 Palpitations: Secondary | ICD-10-CM | POA: Diagnosis not present

## 2015-12-04 DIAGNOSIS — Z6824 Body mass index (BMI) 24.0-24.9, adult: Secondary | ICD-10-CM | POA: Diagnosis not present

## 2015-12-10 ENCOUNTER — Telehealth: Payer: Self-pay | Admitting: Cardiology

## 2015-12-10 NOTE — Telephone Encounter (Signed)
Received records from [facility] for appointment on 12/20/15 with Dr Percival Spanish.  Records given to [facility] (medical records) for Dr Hochrein's schedule on 12/20/15. lp

## 2015-12-19 NOTE — Progress Notes (Signed)
Cardiology Office Note   Date:  12/20/2015   ID:  [name], DOB 02/28/1956, MRN [id number]  PCP:  Glo Herring., MD  Cardiologist:   Minus Breeding, MD   No chief complaint on file.     History of Present Illness: [name] is a 60 y.o. female who presents for evaluation of hypotension. This has been going on for several weeks. She first noticed this and was feeling lightheaded with this. She actually went to an emergency room on February 11 and I reviewed these records. Her blood pressure was recorded at 130 but she clearly has had blood pressures in the 70s and even lower. She brings a blood pressure diary. There are some systolics in the 0000000. She doesn't have what seems to be increase her heart rate that we would expect. She gets lightheaded, slightly presyncopal and weak although she has not had frank syncope. She's had no other change. She's had no other symptoms of autonomic dysfunction. She's had no acute neurologic complaints. She's not having any weakness. She's not having any memory difficulties. She's had no change dramatically in her weight or hair or speech. She was treated initially with fludrocortisone regained 9 pounds. She's not doing a little bit better on midodrine.  Her blood pressure seems to be better with that. Her thyroid has been normal. She's had cortisol levels drawn which also are normal. She's had no new medications. She's had a few palpitations.  Past Medical History  Diagnosis Date  . Chronic back pain   . Complication of anesthesia     shaking and chattery    Past Surgical History  Procedure Laterality Date  . 8 back surgeries    . Left foot surgery    . Partial hysterectomy    . Colonoscopy  10/14/2011    Procedure: COLONOSCOPY;  Surgeon: Rogene Houston, MD;  [facility]: [facility];  Service: Endoscopy;  Laterality: N/A;  1030     Current Outpatient Prescriptions  Medication Sig Dispense Refill  . aspirin EC 81 MG tablet Take 81 mg  by mouth daily.    Marland Kitchen CALCIUM PO Take 1 tablet by mouth daily.    . celecoxib (CELEBREX) 200 MG capsule Take 200 mg by mouth 2 (two) times daily.      . cholecalciferol (VITAMIN D) 1000 UNITS tablet Take 1,000 Units by mouth 2 (two) times daily.      Marland Kitchen docusate sodium (COLACE) 100 MG capsule Take 100-200 mg by mouth 2 (two) times daily. Takes 100 mg in the morning and 200 mg in the evening.    Marland Kitchen HYDROcodone-acetaminophen (NORCO) 10-325 MG tablet Take 1 tablet by mouth every 6 (six) hours as needed for moderate pain.    . midodrine (PROAMATINE) 5 MG tablet Take 1 tablet by mouth 3 (three) times daily.    Marland Kitchen MINIVELLE 0.1 MG/24HR patch Place 1 patch onto the skin 2 (two) times a week.    . tolterodine (DETROL LA) 4 MG 24 hr capsule Take 4 mg by mouth daily.    . vitamin C (ASCORBIC ACID) 500 MG tablet Take 500 mg by mouth daily.     No current facility-administered medications for this visit.    Allergies:   Review of patient's allergies indicates no known allergies.    Social History:  The patient  reports that she has never smoked. She does not have any smokeless tobacco history on file. She reports that she does not drink alcohol or  use illicit drugs.   Family History:  The patient's family history includes Heart attack (age of onset: 36) in her father; Heart disease in her paternal grandfather.    ROS:  Please see the history of present illness.   Otherwise, review of systems are positive for back pain.   All other systems are reviewed and negative.    PHYSICAL EXAM: VS:  BP 82/62 mmHg  Pulse 84  Ht 5\' 8"  (1.727 m)  Wt 156 lb 8 oz (70.988 kg)  BMI 23.80 kg/m2 , BMI Body mass index is 23.8 kg/(m^2). GENERAL:  Well appearing HEENT:  Pupils equal round and reactive, fundi not visualized, oral mucosa unremarkable NECK:  No jugular venous distention, waveform within normal limits, carotid upstroke brisk and symmetric, no bruits, no thyromegaly LYMPHATICS:  No cervical, inguinal  adenopathy LUNGS:  Clear to auscultation bilaterally BACK:  No CVA tenderness CHEST:  Unremarkable HEART:  PMI not displaced or sustained,S1 and S2 within normal limits, no S3, no S4, no clicks, no rubs, no murmurs ABD:  Flat, positive bowel sounds normal in frequency in pitch, no bruits, no rebound, no guarding, no midline pulsatile mass, no hepatomegaly, no splenomegaly EXT:  2 plus pulses throughout, no edema, no cyanosis no clubbing SKIN:  No rashes no nodules NEURO:  Cranial nerves II through XII grossly intact, motor grossly intact throughout PSYCH:  Cognitively intact, oriented to person place and time    EKG:  EKG is ordered today. The ekg ordered today demonstrates sinus rhythm, rate 51, axis within normal limits, intervals within normal limits, no acute ST-T wave changes   Recent Labs: 11/16/2015: BUN 19; Creatinine, Ser 0.72; Hemoglobin 13.3; Platelets 161; Potassium 4.2; Sodium 140    Lipid Panel No results found for: CHOL, TRIG, HDL, CHOLHDL, VLDL, LDLCALC, LDLDIRECT    Wt Readings from Last 3 Encounters:  12/20/15 156 lb 8 oz (70.988 kg)  11/16/15 156 lb (70.761 kg)  09/21/11 215 lb 14.4 oz (97.932 kg)      Other studies Reviewed: Additional studies/ records that were reviewed today include: Dr Nolon Rod office records. Review of the above records demonstrates:  Please see elsewhere in the note.     ASSESSMENT AND PLAN:  HYPOTENSION:  She has hypertension but I don't see any evidence of nonionic dysfunction, acute neurologic process, drug induced, chronic neurologic disease. I would've suspected Addison's but this has apparently been evaluated. For now I agree with symptomatic treatment. We talked about salt which she has been increasing. She's been drinking Gatorade. She's taking the midodrine we discussed the fact that this is typically not taken 4 hours before bedtime. We talked about the timing of this. She can remain on the current dose though she might need to  increase her third dose of the day to 10 mg eventually make sure she has reasonable heart rate response activities I am going to check a 24-hour Holter I'll see her back after this.     Current medicines are reviewed at length with the patient today.  The patient does not have concerns regarding medicines.  The following changes have been made:  no change  Labs/ tests ordered today include: Holter     Disposition:   FU with me after the Holter    Signed, Minus Breeding, MD  12/20/2015 9:41 AM    [place]

## 2015-12-20 ENCOUNTER — Ambulatory Visit (INDEPENDENT_AMBULATORY_CARE_PROVIDER_SITE_OTHER): Payer: 59 | Admitting: Cardiology

## 2015-12-20 ENCOUNTER — Encounter: Payer: Self-pay | Admitting: Cardiology

## 2015-12-20 VITALS — BP 82/62 | HR 84 | Ht 68.0 in | Wt 156.5 lb

## 2015-12-20 DIAGNOSIS — I950 Idiopathic hypotension: Secondary | ICD-10-CM | POA: Insufficient documentation

## 2015-12-20 DIAGNOSIS — I959 Hypotension, unspecified: Secondary | ICD-10-CM | POA: Diagnosis not present

## 2015-12-20 NOTE — Patient Instructions (Signed)
Your physician recommends that you schedule a follow-up appointment in: [place] has recommended that you wear a 24 hour holter monitor. Holter monitors are medical devices that record the heart's electrical activity. Doctors most often use these monitors to diagnose arrhythmias. Arrhythmias are problems with the speed or rhythm of the heartbeat. The monitor is a small, portable device. You can wear one while you do your normal daily activities. This is usually used to diagnose what is causing palpitations/syncope (passing out).

## 2015-12-23 ENCOUNTER — Encounter (INDEPENDENT_AMBULATORY_CARE_PROVIDER_SITE_OTHER): Payer: 59

## 2015-12-23 ENCOUNTER — Telehealth: Payer: Self-pay | Admitting: *Deleted

## 2015-12-23 DIAGNOSIS — I959 Hypotension, unspecified: Secondary | ICD-10-CM

## 2015-12-23 MED ORDER — MIDODRINE HCL 5 MG PO TABS: ORAL_TABLET | ORAL | Status: DC

## 2015-12-23 NOTE — Telephone Encounter (Signed)
Spoke to patient She states she dose not have enough quantity of midodrine 5 mg  To continue with 2 tablet in am , 2 tablet in lunch,1 tablet in the evening   original medication was 3 x times a day by Dr Gerarda Fraction  Patient would like to know if Dr Percival Spanish will increase quantity  Patient states she had an episode prior to the 24 monitor being completed on Saturday. F/u in 6 weeks   RN informed patient will defer to Dr Percival Spanish   RN spoke to [place]-  verbal order given to increase quantity Patient aware E-sent medications

## 2015-12-26 DIAGNOSIS — M47816 Spondylosis without myelopathy or radiculopathy, lumbar region: Secondary | ICD-10-CM | POA: Diagnosis not present

## 2015-12-26 DIAGNOSIS — M4302 Spondylolysis, cervical region: Secondary | ICD-10-CM | POA: Diagnosis not present

## 2015-12-27 ENCOUNTER — Telehealth: Payer: Self-pay | Admitting: Cardiology

## 2015-12-27 NOTE — Telephone Encounter (Signed)
Pt wants to know if her Hollter monitor results are ready?

## 2015-12-27 NOTE — Telephone Encounter (Signed)
Left detailed message that monitor results are not back yet

## 2016-01-03 ENCOUNTER — Telehealth: Payer: Self-pay | Admitting: Cardiology

## 2016-01-03 NOTE — Telephone Encounter (Signed)
New message ° ° ° ° ° °Calling to get monitor results °

## 2016-01-03 NOTE — Telephone Encounter (Signed)
Spoke to patient.  Monitor Result given . Verbalized understanding  

## 2016-02-03 NOTE — Progress Notes (Signed)
Cardiology Office Note   Date:  02/04/2016   ID:  [name], DOB 1956-01-30, MRN [id number]  PCP:  Glo Herring., MD  Cardiologist:   Minus Breeding, MD   No chief complaint on file.     History of Present Illness: [name] is a 60 y.o. female who presents for evaluation of hypotension. She has had low blood pressures with orthostatic symptoms.  She did have cortisol checked and she nas normal.  Since I last saw her we changed the timing of her Midodrine.  Her BPs, though still low are better.  She has had no syncope and is not having orthostatic symptoms. She brings a diary and I reviewed the blood pressure is sometimes in the 80s and 0000000 and [id number] systolic range. She's not having any new palpitations. She did wear a Holter monitor which demonstrated no arrhythmias.   Past Medical History  Diagnosis Date  . Chronic back pain   . Complication of anesthesia     shaking and chattery    Past Surgical History  Procedure Laterality Date  . 8 back surgeries    . Left foot surgery    . Partial hysterectomy    . Colonoscopy  10/14/2011    Procedure: COLONOSCOPY;  Surgeon: Rogene Houston, MD;  [facility]: [facility];  Service: Endoscopy;  Laterality: N/A;  1030     Current Outpatient Prescriptions  Medication Sig Dispense Refill  . aspirin EC 81 MG tablet Take 81 mg by mouth daily.    Marland Kitchen CALCIUM PO Take 1 tablet by mouth daily.    . celecoxib (CELEBREX) 200 MG capsule Take 200 mg by mouth 2 (two) times daily.      . cholecalciferol (VITAMIN D) 1000 UNITS tablet Take 1,000 Units by mouth 2 (two) times daily.      Marland Kitchen docusate sodium (COLACE) 100 MG capsule Take 100-200 mg by mouth 2 (two) times daily. Takes 100 mg in the morning and 200 mg in the evening.    Marland Kitchen HYDROcodone-acetaminophen (NORCO) 10-325 MG tablet Take 1 tablet by mouth every 6 (six) hours as needed for moderate pain.    . midodrine (PROAMATINE) 5 MG tablet Take 2 tablets in morning , 2 tablets at lunch, 1  tablet in evening - take at least  5 hours prior bedtime 150 tablet 2  . MINIVELLE 0.1 MG/24HR patch Place 1 patch onto the skin 2 (two) times a week.    . tolterodine (DETROL LA) 4 MG 24 hr capsule Take 4 mg by mouth daily.    . vitamin C (ASCORBIC ACID) 500 MG tablet Take 500 mg by mouth daily.     No current facility-administered medications for this visit.    Allergies:   Review of patient's allergies indicates no known allergies.    ROS:  Please see the history of present illness.   Otherwise, review of systems are positive for back pain.   All other systems are reviewed and negative.    PHYSICAL EXAM: VS:  Ht 5\' 8"  (1.727 m)  Wt 161 lb 12.8 oz (73.392 kg)  BMI 24.61 kg/m2 , BMI Body mass index is 24.61 kg/(m^2). GENERAL:  Well appearing NECK:  No jugular venous distention, waveform within normal limits, carotid upstroke brisk and symmetric, no bruits, no thyromegaly LUNGS:  Clear to auscultation bilaterally HEART:  PMI not displaced or sustained,S1 and S2 within normal limits, no S3, no S4, no clicks, no rubs, no murmurs ABD:  Flat,  positive bowel sounds normal in frequency in pitch, no bruits, no rebound, no guarding, no midline pulsatile mass, no hepatomegaly, no splenomegaly EXT:  2 plus pulses throughout, no edema, no cyanosis no clubbing   EKG:  EKG is not ordered today.  Recent Labs: 11/16/2015: BUN 19; Creatinine, Ser 0.72; Hemoglobin 13.3; Platelets 161; Potassium 4.2; Sodium 140    Lipid Panel No results found for: CHOL, TRIG, HDL, CHOLHDL, VLDL, LDLCALC, LDLDIRECT    Wt Readings from Last 3 Encounters:  02/04/16 161 lb 12.8 oz (73.392 kg)  12/20/15 156 lb 8 oz (70.988 kg)  11/16/15 156 lb (70.761 kg)      Other studies Reviewed: Additional studies/ records that were reviewed today include: None Review of the above records demonstrates:    ASSESSMENT AND PLAN:  HYPOTENSION:  She has  improved symptomatically. No change in therapy is indicated.  We talked  about compression stockings relation breakfast strategy in certain situations.  Current medicines are reviewed at length with the patient today.  The patient does not have concerns regarding medicines.  The following changes have been made:  no change  Labs/ tests ordered today include: None    Disposition:   FU with me in 3 months.     Signed, Minus Breeding, MD  02/04/2016 8:05 AM    [place]

## 2016-02-04 ENCOUNTER — Ambulatory Visit (INDEPENDENT_AMBULATORY_CARE_PROVIDER_SITE_OTHER): Payer: 59 | Admitting: Cardiology

## 2016-02-04 ENCOUNTER — Encounter: Payer: Self-pay | Admitting: Cardiology

## 2016-02-04 VITALS — BP 110/73 | HR 55 | Ht 68.0 in | Wt 161.8 lb

## 2016-02-04 DIAGNOSIS — R42 Dizziness and giddiness: Secondary | ICD-10-CM | POA: Diagnosis not present

## 2016-02-04 NOTE — Patient Instructions (Signed)
Your physician wants you to follow-up in: 6 Months You will receive a reminder letter in the mail two months in advance. If you don't receive a letter, please call our office to schedule the follow-up appointment.  

## 2016-02-18 ENCOUNTER — Other Ambulatory Visit: Payer: Self-pay | Admitting: Cardiology

## 2016-02-18 NOTE — Telephone Encounter (Signed)
REFILL 

## 2016-03-06 ENCOUNTER — Other Ambulatory Visit: Payer: Self-pay | Admitting: Physician Assistant

## 2016-03-06 DIAGNOSIS — D485 Neoplasm of uncertain behavior of skin: Secondary | ICD-10-CM | POA: Diagnosis not present

## 2016-03-06 DIAGNOSIS — L821 Other seborrheic keratosis: Secondary | ICD-10-CM | POA: Diagnosis not present

## 2016-03-06 DIAGNOSIS — D239 Other benign neoplasm of skin, unspecified: Secondary | ICD-10-CM | POA: Diagnosis not present

## 2016-06-30 ENCOUNTER — Other Ambulatory Visit: Payer: Self-pay | Admitting: Neurosurgery

## 2016-06-30 DIAGNOSIS — M4302 Spondylolysis, cervical region: Secondary | ICD-10-CM | POA: Diagnosis not present

## 2016-06-30 DIAGNOSIS — M47816 Spondylosis without myelopathy or radiculopathy, lumbar region: Secondary | ICD-10-CM

## 2016-06-30 DIAGNOSIS — M24251 Disorder of ligament, right hip: Secondary | ICD-10-CM | POA: Diagnosis not present

## 2016-07-03 ENCOUNTER — Encounter: Payer: Self-pay | Admitting: Cardiology

## 2016-07-06 ENCOUNTER — Ambulatory Visit
Admission: RE | Admit: 2016-07-06 | Discharge: 2016-07-06 | Disposition: A | Discharge status: Home / Self Care | Payer: 59 | Source: Ambulatory Visit | Attending: Neurosurgery | Admitting: Neurosurgery

## 2016-07-06 DIAGNOSIS — M47816 Spondylosis without myelopathy or radiculopathy, lumbar region: Secondary | ICD-10-CM

## 2016-07-06 DIAGNOSIS — M4326 Fusion of spine, lumbar region: Secondary | ICD-10-CM | POA: Diagnosis not present

## 2016-07-09 DIAGNOSIS — R351 Nocturia: Secondary | ICD-10-CM | POA: Diagnosis not present

## 2016-07-09 DIAGNOSIS — N393 Stress incontinence (female) (male): Secondary | ICD-10-CM | POA: Diagnosis not present

## 2016-07-17 DIAGNOSIS — M4302 Spondylolysis, cervical region: Secondary | ICD-10-CM | POA: Diagnosis not present

## 2016-07-17 DIAGNOSIS — M47816 Spondylosis without myelopathy or radiculopathy, lumbar region: Secondary | ICD-10-CM | POA: Diagnosis not present

## 2016-07-19 NOTE — Progress Notes (Signed)
Cardiology Office Note   Date:  07/20/2016   ID:  [name], DOB 16-Mar-1956, MRN [id number]  PCP:  Glo Herring., MD  Cardiologist:   Minus Breeding, MD   Chief Complaint  Patient presents with  . Hypotension      History of Present Illness: [name] is a 60 y.o. female who presents for evaluation of hypotension. She has had low blood pressures with orthostatic symptoms.  She did have cortisol checked and this was normal.   Since I last saw her she has done well.  She has had only mild orthostatic symptoms although her pressure still remained low. I reviewed her blood pressure diary.  The patient denies any new symptoms such as chest discomfort, neck or arm discomfort. There has been no new shortness of breath, PND or orthopnea. There have been no reported palpitations.   Past Medical History:  Diagnosis Date  . Chronic back pain   . Complication of anesthesia    shaking and chattery    Past Surgical History:  Procedure Laterality Date  . 8 back surgeries    . COLONOSCOPY  10/14/2011   Procedure: COLONOSCOPY;  Surgeon: Rogene Houston, MD;  [facility];  Service: Endoscopy;  Laterality: N/A;  1030  . left foot surgery    . PARTIAL HYSTERECTOMY       Current Outpatient Prescriptions  Medication Sig Dispense Refill  . aspirin EC 81 MG tablet Take 81 mg by mouth daily.    Marland Kitchen CALCIUM PO Take 1 tablet by mouth daily.    . celecoxib (CELEBREX) 200 MG capsule Take 200 mg by mouth 2 (two) times daily.      . cholecalciferol (VITAMIN D) 1000 UNITS tablet Take 1,000 Units by mouth 2 (two) times daily.      Marland Kitchen docusate sodium (COLACE) 100 MG capsule Take 100-200 mg by mouth 2 (two) times daily. Takes 100 mg in the morning and 200 mg in the evening.    Marland Kitchen HYDROcodone-acetaminophen (NORCO) 10-325 MG tablet Take 1 tablet by mouth every 6 (six) hours as needed for moderate pain.    . midodrine (PROAMATINE) 5 MG tablet TAKE 2 TABLETS IN THE MORNING, 2 TABLETS AT  LUNCH, AND 1 TABLET IN THE EVENIN (5 HOURS PRIOR TO BEDTIME). 150 tablet 5  . MINIVELLE 0.1 MG/24HR patch Place 1 patch onto the skin 2 (two) times a week.    . tolterodine (DETROL LA) 4 MG 24 hr capsule Take 4 mg by mouth daily.    . vitamin C (ASCORBIC ACID) 500 MG tablet Take 500 mg by mouth daily.     No current facility-administered medications for this visit.     Allergies:   Review of patient's allergies indicates no known allergies.    ROS:  Please see the history of present illness.   Otherwise, review of systems are positive for none.   All other systems are reviewed and negative.    PHYSICAL EXAM: VS:  BP 122/70   Pulse 60   Ht 5\' 8"  (1.727 m)   Wt 174 lb 9.6 oz (79.2 kg)   BMI 26.55 kg/m  , BMI Body mass index is 26.55 kg/m. GENERAL:  Well appearing NECK:  No jugular venous distention, waveform within normal limits, carotid upstroke brisk and symmetric, no bruits, no thyromegaly LUNGS:  Clear to auscultation bilaterally HEART:  PMI not displaced or sustained,S1 and S2 within normal limits, no S3, no S4, no clicks, no rubs,  no murmurs ABD:  Flat, positive bowel sounds normal in frequency in pitch, no bruits, no rebound, no guarding, no midline pulsatile mass, no hepatomegaly, no splenomegaly EXT:  2 plus pulses throughout, no edema, no cyanosis no clubbing   EKG:  EKG is not not ordered today.  Recent Labs: 11/16/2015: BUN 19; Creatinine, Ser 0.72; Hemoglobin 13.3; Platelets 161; Potassium 4.2; Sodium 140    Lipid Panel No results found for: CHOL, TRIG, HDL, CHOLHDL, VLDL, LDLCALC, LDLDIRECT    Wt Readings from Last 3 Encounters:  07/20/16 174 lb 9.6 oz (79.2 kg)  02/04/16 161 lb 12.8 oz (73.4 kg)  12/20/15 156 lb 8 oz (71 kg)      Other studies Reviewed: Additional studies/ records that were reviewed today include: None Review of the above records demonstrates:    ASSESSMENT AND PLAN:  HYPOTENSION:  She has  improved symptomatically. No change in  therapy is indicated.  She is wearing compression stockings as needed.    Current medicines are reviewed at length with the patient today.  The patient does not have concerns regarding medicines.  The following changes have been made:  no change  Labs/ tests ordered today include: None    Disposition:   FU with me in 12 months.     Signed, Minus Breeding, MD  07/20/2016 9:03 AM    [facility]

## 2016-07-20 ENCOUNTER — Ambulatory Visit (INDEPENDENT_AMBULATORY_CARE_PROVIDER_SITE_OTHER): Payer: 59 | Admitting: Cardiology

## 2016-07-20 ENCOUNTER — Encounter: Payer: Self-pay | Admitting: Cardiology

## 2016-07-20 VITALS — BP 122/70 | HR 60 | Ht 68.0 in | Wt 174.6 lb

## 2016-07-20 DIAGNOSIS — I950 Idiopathic hypotension: Secondary | ICD-10-CM | POA: Diagnosis not present

## 2016-07-20 MED ORDER — MIDODRINE HCL 5 MG PO TABS: ORAL_TABLET | ORAL | 5 refills | Status: DC

## 2016-07-20 NOTE — Patient Instructions (Signed)

## 2016-08-24 DIAGNOSIS — T85193A Other mechanical complication of implanted electronic neurostimulator, generator, initial encounter: Secondary | ICD-10-CM | POA: Diagnosis not present

## 2016-08-24 DIAGNOSIS — Z780 Asymptomatic menopausal state: Secondary | ICD-10-CM | POA: Diagnosis not present

## 2016-08-25 DIAGNOSIS — T85193A Other mechanical complication of implanted electronic neurostimulator, generator, initial encounter: Secondary | ICD-10-CM | POA: Diagnosis not present

## 2016-08-25 DIAGNOSIS — Z780 Asymptomatic menopausal state: Secondary | ICD-10-CM | POA: Diagnosis not present

## 2016-11-16 DIAGNOSIS — I950 Idiopathic hypotension: Secondary | ICD-10-CM | POA: Diagnosis not present

## 2016-11-16 DIAGNOSIS — E663 Overweight: Secondary | ICD-10-CM | POA: Diagnosis not present

## 2016-11-16 DIAGNOSIS — Z6828 Body mass index (BMI) 28.0-28.9, adult: Secondary | ICD-10-CM | POA: Diagnosis not present

## 2016-11-16 DIAGNOSIS — Z23 Encounter for immunization: Secondary | ICD-10-CM | POA: Diagnosis not present

## 2016-11-16 DIAGNOSIS — Z0001 Encounter for general adult medical examination with abnormal findings: Secondary | ICD-10-CM | POA: Diagnosis not present

## 2017-01-14 DIAGNOSIS — M4302 Spondylolysis, cervical region: Secondary | ICD-10-CM | POA: Diagnosis not present

## 2017-01-14 DIAGNOSIS — M47816 Spondylosis without myelopathy or radiculopathy, lumbar region: Secondary | ICD-10-CM | POA: Diagnosis not present

## 2017-01-14 DIAGNOSIS — M24251 Disorder of ligament, right hip: Secondary | ICD-10-CM | POA: Diagnosis not present

## 2017-01-28 ENCOUNTER — Other Ambulatory Visit: Payer: Self-pay | Admitting: Cardiology

## 2017-01-28 NOTE — Telephone Encounter (Signed)
Rx has been sent to the pharmacy electronically. ° °

## 2017-04-06 ENCOUNTER — Other Ambulatory Visit: Payer: Self-pay | Admitting: Cardiology

## 2017-07-12 ENCOUNTER — Emergency Department (HOSPITAL_COMMUNITY): Payer: 59

## 2017-07-12 ENCOUNTER — Encounter (HOSPITAL_COMMUNITY): Payer: Self-pay

## 2017-07-12 ENCOUNTER — Emergency Department (HOSPITAL_COMMUNITY)
Admission: EM | Admit: 2017-07-12 | Discharge: 2017-07-12 | Disposition: A | Discharge status: Home / Self Care | Payer: 59 | Attending: Emergency Medicine | Admitting: Emergency Medicine

## 2017-07-12 DIAGNOSIS — M25572 Pain in left ankle and joints of left foot: Secondary | ICD-10-CM | POA: Diagnosis not present

## 2017-07-12 DIAGNOSIS — S8992XA Unspecified injury of left lower leg, initial encounter: Secondary | ICD-10-CM | POA: Insufficient documentation

## 2017-07-12 DIAGNOSIS — W19XXXA Unspecified fall, initial encounter: Secondary | ICD-10-CM

## 2017-07-12 DIAGNOSIS — M79672 Pain in left foot: Secondary | ICD-10-CM | POA: Diagnosis not present

## 2017-07-12 DIAGNOSIS — W11XXXA Fall on and from ladder, initial encounter: Secondary | ICD-10-CM | POA: Diagnosis not present

## 2017-07-12 DIAGNOSIS — Z79899 Other long term (current) drug therapy: Secondary | ICD-10-CM | POA: Insufficient documentation

## 2017-07-12 DIAGNOSIS — S99922A Unspecified injury of left foot, initial encounter: Secondary | ICD-10-CM | POA: Diagnosis not present

## 2017-07-12 DIAGNOSIS — Y999 Unspecified external cause status: Secondary | ICD-10-CM | POA: Diagnosis not present

## 2017-07-12 DIAGNOSIS — Y939 Activity, unspecified: Secondary | ICD-10-CM | POA: Diagnosis not present

## 2017-07-12 DIAGNOSIS — Y929 Unspecified place or not applicable: Secondary | ICD-10-CM | POA: Insufficient documentation

## 2017-07-12 DIAGNOSIS — S80912A Unspecified superficial injury of left knee, initial encounter: Secondary | ICD-10-CM | POA: Diagnosis not present

## 2017-07-12 DIAGNOSIS — Z7982 Long term (current) use of aspirin: Secondary | ICD-10-CM | POA: Diagnosis not present

## 2017-07-12 DIAGNOSIS — M25562 Pain in left knee: Secondary | ICD-10-CM | POA: Diagnosis not present

## 2017-07-12 DIAGNOSIS — S99912A Unspecified injury of left ankle, initial encounter: Secondary | ICD-10-CM | POA: Diagnosis not present

## 2017-07-12 DIAGNOSIS — M25552 Pain in left hip: Secondary | ICD-10-CM

## 2017-07-12 DIAGNOSIS — S79912A Unspecified injury of left hip, initial encounter: Secondary | ICD-10-CM | POA: Diagnosis not present

## 2017-07-12 NOTE — ED Triage Notes (Signed)
Reports of falling off 3rd step of ladder this evening. Complains of pain to left leg/left hip. Patient ambulatory to triage. Denies neck pain/loc.

## 2017-07-12 NOTE — Discharge Instructions (Signed)
Continue taking your Celebrex as directed, take the Norco if needed for pain as directed. Apply ice packs on and off to your hip, knee, and ankle. Call [facility] orthopedics in one week to arrange follow-up appointment if not improving. Return here for any worsening symptoms.

## 2017-07-12 NOTE — ED Provider Notes (Signed)
[facility] DEPT Provider Note   CSN: [id number] Arrival date & time: 07/12/17  1611     History   Chief Complaint Chief Complaint  Patient presents with  . Fall    HPI [name] is a 61 y.o. female.  HPI   [name] is a 61 y.o. female who presents to the Emergency Department complaining of Left hip, left knee, left foot and ankle pain that began several hours before arrival. States the pain began after she fell proximally 3 feet off of a ladder. She fell landing on her left side and back. She describes a throbbing pain to her posterior left hip pain is worse with movement and weightbearing. The pain is worse with bending. She has applied ice with minimal relief. She denies numbness, headache, head injury or LOC, neck pain or back pain.   Past Medical History:  Diagnosis Date  . Chronic back pain   . Complication of anesthesia    shaking and chattery    Patient Active Problem List   Diagnosis Date Noted  . Idiopathic hypotension 12/20/2015  . Chronic back pain 09/21/2011    Past Surgical History:  Procedure Laterality Date  . 8 back surgeries    . COLONOSCOPY  10/14/2011   Procedure: COLONOSCOPY;  Surgeon: Rogene Houston, MD;  Location: [facility];  Service: Endoscopy;  Laterality: N/A;  1030  . left foot surgery    . PARTIAL HYSTERECTOMY      OB History    No data available       Home Medications    Prior to Admission medications   Medication Sig Start Date End Date Taking? Authorizing Provider  aspirin EC 81 MG tablet Take 81 mg by mouth daily.    [provider]  CALCIUM PO Take 1 tablet by mouth daily.    [provider]  celecoxib (CELEBREX) 200 MG capsule Take 200 mg by mouth 2 (two) times daily.      [provider]  cholecalciferol (VITAMIN D) 1000 UNITS tablet Take 1,000 Units by mouth 2 (two) times daily.      [provider]  docusate sodium (COLACE) 100 MG capsule Take 100-200 mg by mouth 2  (two) times daily. Takes 100 mg in the morning and 200 mg in the evening.    [provider]  HYDROcodone-acetaminophen (NORCO) 10-325 MG tablet Take 1 tablet by mouth every 6 (six) hours as needed for moderate pain.    [provider]  midodrine (PROAMATINE) 5 MG tablet TAKE 2 TABLETS IN THE MORNING, 2 TABLETS AT LUNCH, AND 1 TABLET IN THE EVENING (5 HOURS PRIOR TO BEDTIME). 04/06/17   Minus Breeding, MD  MINIVELLE 0.1 MG/24HR patch Place 1 patch onto the skin 2 (two) times a week. 10/16/15   [provider]  tolterodine (DETROL LA) 4 MG 24 hr capsule Take 4 mg by mouth daily.    [provider]  vitamin C (ASCORBIC ACID) 500 MG tablet Take 500 mg by mouth daily.    [provider]    Family History Family History  Problem Relation Age of Onset  . Heart attack Father 59       CABG  . Heart disease Paternal Grandfather     Social History Social History  Substance Use Topics  . Smoking status: Never Smoker  . Smokeless tobacco: Never Used  . Alcohol use No     Allergies   Patient has no known allergies.  Review of Systems Review of Systems  Constitutional: Negative for chills and fever.  Eyes: Negative for visual disturbance.  Cardiovascular: Negative for chest pain.  Gastrointestinal: Negative for abdominal pain, nausea and vomiting.  Musculoskeletal: Positive for arthralgias (Left knee, hip, ankle and foot pain). Negative for back pain, joint swelling and neck pain.  Skin: Negative for color change and wound.  Neurological: Negative for dizziness, syncope, weakness, numbness and headaches.  Psychiatric/Behavioral: Negative for confusion.  All other systems reviewed and are negative.    Physical Exam Updated Vital Signs BP 115/76 (BP Location: Right Arm)   Pulse (!) 59   Temp 98.5 F (36.9 C) (Oral)   Resp 18   Ht 5\' 8"  (1.727 m)   Wt 83.9 kg (185 lb)   SpO2 100%   BMI 28.13 kg/m   Physical Exam  Constitutional: She  is oriented to person, place, and time. She appears well-developed and well-nourished. No distress.  HENT:  Head: Atraumatic.  Mouth/Throat: Oropharynx is clear and moist.  Eyes: Pupils are equal, round, and reactive to light. EOM are normal.  Neck: Normal range of motion and phonation normal. Neck supple. No spinous process tenderness and no muscular tenderness present.  Cardiovascular: Normal rate, regular rhythm and intact distal pulses.   Pulmonary/Chest: Effort normal and breath sounds normal. No respiratory distress. She exhibits no tenderness.  Abdominal: Soft. She exhibits no distension. There is no tenderness. There is no guarding.  Musculoskeletal: She exhibits tenderness. She exhibits no edema or deformity.  ttp of the posterior left hip and lateral left knee.  No edema or abrasions.  Diffuse ttp of the left ankle, no edema.  No bony deformities.   Neurological: She is alert and oriented to person, place, and time. No sensory deficit.  Skin: Skin is warm. Capillary refill takes less than 2 seconds.  Psychiatric: She has a normal mood and affect.  Nursing note and vitals reviewed.    ED Treatments / Results  Labs (all labs ordered are listed, but only abnormal results are displayed) Labs Reviewed - No data to display  EKG  EKG Interpretation None       Radiology Dg Ankle Complete Left  Result Date: 07/12/2017 CLINICAL DATA:  [place] and complains of left heel pain. EXAM: LEFT ANKLE COMPLETE - 3+ VIEW COMPARISON:  None. FINDINGS: Negative for fracture or dislocation. No soft tissue swelling. Normal alignment of left ankle. Small spur along the plantar aspect of the calcaneus. IMPRESSION: No acute abnormality. Small calcaneal spur. Electronically Signed   By: Markus Daft M.D.   On: 07/12/2017 17:56   Ct Hip Left Wo Contrast  Result Date: 07/12/2017 CLINICAL DATA:  Fall from a ladder this evening. Left hip and leg pain. EXAM: CT OF THE LEFT HIP WITHOUT CONTRAST TECHNIQUE:  Multidetector CT imaging of the left hip was performed according to the standard protocol. Multiplanar CT image reconstructions were also generated. COMPARISON:  None FINDINGS: Bones/Joint/Cartilage No fracture or hip joint effusion is identified. Minimal degenerative chondral thinning. Ligaments Suboptimally assessed by CT. Muscles and Tendons Linear calcifications in the distal gluteus medius tendon. Soft tissues Unremarkable IMPRESSION: 1. No fracture or acute bony findings identified. Electronically Signed   By: Van Clines M.D.   On: 07/12/2017 18:11   Dg Knee Complete 4 Views Left  Result Date: 07/12/2017 CLINICAL DATA:  [place] and landed on back with posterior left knee pain. EXAM: LEFT KNEE - COMPLETE 4+ VIEW COMPARISON:  None. FINDINGS: Negative for fracture, dislocation or  joint effusion. Mild spurring and degenerative changes along the superior aspect of the patella. Alignment is normal. IMPRESSION: No acute abnormality. Electronically Signed   By: Markus Daft M.D.   On: 07/12/2017 17:55   Dg Foot Complete Left  Result Date: 07/12/2017 CLINICAL DATA:  [place] and complains of left heel pain. EXAM: LEFT FOOT - COMPLETE 3+ VIEW COMPARISON:  Left ankle 07/12/2017 FINDINGS: There is no evidence of fracture or dislocation. Soft tissues are unremarkable. Small plantar calcaneal spur. IMPRESSION: No acute abnormality. Small calcaneal spur. Electronically Signed   By: Markus Daft M.D.   On: 07/12/2017 17:57    Procedures Procedures (including critical care time)  Medications Ordered in ED Medications - No data to display   Initial Impression / Assessment and Plan / ED Course  I have reviewed the triage vital signs and the nursing notes.  Pertinent labs & imaging results that were available during my care of the patient were reviewed by me and considered in my medical decision making (see chart for details).     X-rays, CT hip are negative for fracture. NV intact ASO and knee immobilizer  placed by nursing staff. Discussed importance of ortho f/u Patient has NSAID and narcotic pain medication at home. Ambulatory at d/c She requests orthopedic follow-up with [facility]. Referral information given.  Final Clinical Impressions(s) / ED Diagnoses   Final diagnoses:  Injury of left knee, initial encounter  Acute left ankle pain  Fall, initial encounter  Acute pain of left hip    New Prescriptions New Prescriptions   No medications on file     Kem Parkinson, Hershal Coria 07/13/17 1540    Noemi Chapel, MD 07/13/17 2013

## 2017-07-15 DIAGNOSIS — H2513 Age-related nuclear cataract, bilateral: Secondary | ICD-10-CM | POA: Diagnosis not present

## 2017-07-29 ENCOUNTER — Ambulatory Visit: Payer: 59 | Admitting: Cardiology

## 2017-08-05 DIAGNOSIS — H43312 Vitreous membranes and strands, left eye: Secondary | ICD-10-CM | POA: Diagnosis not present

## 2017-08-05 DIAGNOSIS — H2513 Age-related nuclear cataract, bilateral: Secondary | ICD-10-CM | POA: Diagnosis not present

## 2017-08-20 ENCOUNTER — Encounter: Payer: Self-pay | Admitting: *Deleted

## 2017-08-20 DIAGNOSIS — S39012A Strain of muscle, fascia and tendon of lower back, initial encounter: Secondary | ICD-10-CM | POA: Diagnosis not present

## 2017-08-20 DIAGNOSIS — M47816 Spondylosis without myelopathy or radiculopathy, lumbar region: Secondary | ICD-10-CM | POA: Diagnosis not present

## 2017-08-31 NOTE — Progress Notes (Signed)
Cardiology Office Note   Date:  09/01/2017   ID:  [name], DOB [birth year], MRN [id number]  PCP:  Redmond School, MD  Cardiologist:   Minus Breeding, MD   Chief Complaint  Patient presents with  . Hypotension      History of Present Illness: [name] is a 61 y.o. female who presents for evaluation of hypotension. She has had low blood pressures with orthostatic symptoms.  She did have cortisol checked and this was normal.   Since I last saw her she has had a couple of episodes of light headedness but no syncope.  The patient denies any new symptoms such as chest discomfort, neck or arm discomfort. There has been no new shortness of breath, PND or orthopnea.   She continues to be limited by chronic back pain.   Past Medical History:  Diagnosis Date  . Chronic back pain   . Complication of anesthesia    shaking and chattery    Past Surgical History:  Procedure Laterality Date  . 8 back surgeries    . COLONOSCOPY  10/14/2011   Procedure: COLONOSCOPY;  Surgeon: Rogene Houston, MD;  Location: [facility];  Service: Endoscopy;  Laterality: N/A;  1030  . left foot surgery    . PARTIAL HYSTERECTOMY       Current Outpatient Medications  Medication Sig Dispense Refill  . aspirin EC 81 MG tablet Take 81 mg by mouth daily.    Marland Kitchen CALCIUM PO Take 1 tablet by mouth daily.    . celecoxib (CELEBREX) 200 MG capsule Take 200 mg by mouth 2 (two) times daily.      . cholecalciferol (VITAMIN D) 1000 UNITS tablet Take 1,000 Units by mouth 2 (two) times daily.      Marland Kitchen docusate sodium (COLACE) 100 MG capsule Take 100-200 mg by mouth 2 (two) times daily. Takes 100 mg in the morning and 200 mg in the evening.    Marland Kitchen HYDROcodone-acetaminophen (NORCO) 10-325 MG tablet Take 1 tablet by mouth every 6 (six) hours as needed for moderate pain.    . midodrine (PROAMATINE) 5 MG tablet TAKE 2 TABLETS IN THE MORNING, 2 TABLETS AT LUNCH, AND 1 TABLET IN THE EVENING (5 HOURS PRIOR TO BEDTIME). 150  tablet 3  . MINIVELLE 0.1 MG/24HR patch Place 1 patch onto the skin 2 (two) times a week.    . tolterodine (DETROL LA) 4 MG 24 hr capsule Take 4 mg by mouth daily.    . vitamin C (ASCORBIC ACID) 500 MG tablet Take 500 mg by mouth daily.     No current facility-administered medications for this visit.     Allergies:   2,4-d dimethylamine (amisol); Balsam; Methylisothiazolinone; Propolis; and Shellac    ROS:  Please see the history of present illness.   Otherwise, review of systems are positive for none.   All other systems are reviewed and negative.    PHYSICAL EXAM: VS:  BP 124/70   Pulse (!) 55   Ht 5\' 8"  (1.727 m)   Wt 203 lb 6.4 oz (92.3 kg)   BMI 30.93 kg/m  , BMI Body mass index is 30.93 kg/m.  GENERAL:  Well appearing NECK:  No jugular venous distention, waveform within normal limits, carotid upstroke brisk and symmetric, no bruits, no thyromegaly LUNGS:  Clear to auscultation bilaterally CHEST:  Unremarkable HEART:  PMI not displaced or sustained,S1 and S2 within normal limits, no S3, no S4, no clicks, no rubs, no  murmurs ABD:  Flat, positive bowel sounds normal in frequency in pitch, no bruits, no rebound, no guarding, no midline pulsatile mass, no hepatomegaly, no splenomegaly EXT:  2 plus pulses throughout, no edema, no cyanosis no clubbing  GENERAL:  Well appearing NECK:  No jugular venous distention, waveform within normal limits, carotid upstroke brisk and symmetric, no bruits, no thyromegaly LUNGS:  Clear to auscultation bilaterally HEART:  PMI not displaced or sustained,S1 and S2 within normal limits, no S3, no S4, no clicks, no rubs, no murmurs ABD:  Flat, positive bowel sounds normal in frequency in pitch, no bruits, no rebound, no guarding, no midline pulsatile mass, no hepatomegaly, no splenomegaly EXT:  2 plus pulses throughout, no edema, no cyanosis no clubbing   EKG:  EKG is   ordered today. Sinus rhythm, rate 55, axis within normal limits, within normal  limits, no acute ST-T wave changes.  Recent Labs: No results found for requested labs within last 8760 hours.    Lipid Panel No results found for: CHOL, TRIG, HDL, CHOLHDL, VLDL, LDLCALC, LDLDIRECT    Wt Readings from Last 3 Encounters:  09/01/17 203 lb 6.4 oz (92.3 kg)  07/12/17 185 lb (83.9 kg)  07/20/16 174 lb 9.6 oz (79.2 kg)      Other studies Reviewed: Additional studies/ records that were reviewed today include: None  Review of the above records demonstrates:    ASSESSMENT AND PLAN:  HYPOTENSION: This is mild and well treated with the Midrin and salt.  She wears compression stockings when needed.  No change in therapy or further testing is indicated.  She will get a blood pressure cuff to evaluate her blood pressure when she is feeling lightheaded.   Current medicines are reviewed at length with the patient today.  The patient does not have concerns regarding medicines.  The following changes have been made:  None  Labs/ tests ordered today include: None    Disposition:   FU with me in the18 months.     Signed, Minus Breeding, MD  09/01/2017 2:30 PM    [place]

## 2017-09-01 ENCOUNTER — Ambulatory Visit (INDEPENDENT_AMBULATORY_CARE_PROVIDER_SITE_OTHER): Payer: 59 | Admitting: Cardiology

## 2017-09-01 ENCOUNTER — Encounter: Payer: Self-pay | Admitting: Cardiology

## 2017-09-01 VITALS — BP 124/70 | HR 55 | Ht 68.0 in | Wt 203.4 lb

## 2017-09-01 DIAGNOSIS — I959 Hypotension, unspecified: Secondary | ICD-10-CM

## 2017-09-01 MED ORDER — MIDODRINE HCL 5 MG PO TABS: ORAL_TABLET | ORAL | 3 refills | Status: DC

## 2017-09-01 NOTE — Patient Instructions (Signed)
Medication Instructions:  Continue current medications  If you need a refill on your cardiac medications before your next appointment, please call your pharmacy.  Labwork: None Ordered  Testing/Procedures: None Ordered  Special Instructions: Amron blood pressure cuff  Follow-Up: Your physician wants you to follow-up in: 18 Months. You should receive a reminder letter in the mail two months in advance. If you do not receive a letter, please call our office [phone].   Thank you for choosing [facility]!!

## 2017-09-03 NOTE — Addendum Note (Signed)
Addended by: Zebedee Iba on: 09/03/2017 03:49 PM   Modules accepted: Orders

## 2017-09-07 DIAGNOSIS — Z23 Encounter for immunization: Secondary | ICD-10-CM | POA: Diagnosis not present

## 2017-09-09 DIAGNOSIS — N3942 Incontinence without sensory awareness: Secondary | ICD-10-CM | POA: Diagnosis not present

## 2017-09-09 DIAGNOSIS — N393 Stress incontinence (female) (male): Secondary | ICD-10-CM | POA: Diagnosis not present

## 2017-10-22 DIAGNOSIS — E6609 Other obesity due to excess calories: Secondary | ICD-10-CM | POA: Diagnosis not present

## 2017-10-22 DIAGNOSIS — Z6831 Body mass index (BMI) 31.0-31.9, adult: Secondary | ICD-10-CM | POA: Diagnosis not present

## 2017-10-22 DIAGNOSIS — Z1389 Encounter for screening for other disorder: Secondary | ICD-10-CM | POA: Diagnosis not present

## 2017-10-22 DIAGNOSIS — M7732 Calcaneal spur, left foot: Secondary | ICD-10-CM | POA: Diagnosis not present

## 2017-10-22 DIAGNOSIS — S9032XA Contusion of left foot, initial encounter: Secondary | ICD-10-CM | POA: Diagnosis not present

## 2017-11-08 ENCOUNTER — Encounter: Payer: Self-pay | Admitting: Podiatry

## 2017-11-08 ENCOUNTER — Ambulatory Visit (INDEPENDENT_AMBULATORY_CARE_PROVIDER_SITE_OTHER): Payer: Medicare Other

## 2017-11-08 ENCOUNTER — Ambulatory Visit (INDEPENDENT_AMBULATORY_CARE_PROVIDER_SITE_OTHER): Payer: 59 | Admitting: Podiatry

## 2017-11-08 VITALS — BP 129/74 | HR 59 | Resp 16

## 2017-11-08 DIAGNOSIS — M722 Plantar fascial fibromatosis: Secondary | ICD-10-CM

## 2017-11-08 MED ORDER — TRIAMCINOLONE ACETONIDE 10 MG/ML IJ SUSP
10.0000 mg | Freq: Once | INTRAMUSCULAR | Status: AC
  Administered 2017-11-08: 10 mg

## 2017-11-08 NOTE — Progress Notes (Signed)
   Subjective:    Patient ID: [name], female    DOB: December 24, 1955, 62 y.o.   MRN: [id number]  HPI    Review of Systems  All other systems reviewed and are negative.      Objective:   Physical Exam        Assessment & Plan:

## 2017-11-08 NOTE — Patient Instructions (Signed)

## 2017-11-10 NOTE — Progress Notes (Signed)
Subjective:   Patient ID: [name], female   DOB: 62 y.o.   MRN: [id number]   HPI Patient presents with pain in the plantar heel left at the insertional point of the tendon into the calcaneus that has been present now for about 4 months and is gradually worsened over that time.  Patient states it is difficult to walk comfortably and difficult for ambulation and patient also states she does not smoke and likes to be active   Review of Systems  All other systems reviewed and are negative.       Objective:  Physical Exam  Constitutional: She appears well-developed and well-nourished.  Cardiovascular: Intact distal pulses.  Pulmonary/Chest: Effort normal.  Musculoskeletal: Normal range of motion.  Neurological: She is alert.  Skin: Skin is warm.  Nursing note and vitals reviewed.   Neurovascular status found to be intact muscle strength adequate range of motion is within normal limits with patient found to have exquisite discomfort plantar aspect heel left at the insertional point tendon into the calcaneus with inflammation and fluid around the medial band.  Patient is found to have good digital perfusion and is well oriented x3     Assessment:  Acute plantar fasciitis left with inflammation fluid around the medial band with injury in October that probably was contributory to problem     Plan:  H&P and x-ray reviewed with patient.  Today I went ahead and I injected the plantar fascia 3 mg Kenalog 5 mg Xylocaine and applied fascial brace with instructions on usage.  Patient also will begin physical therapy is placed on oral anti-inflammatory and was given instructions for supportive shoes.  Reappoint 2 weeks or earlier if needed  X-rays indicate that there is small spur with no indication of stress fracture or advanced arthritis signed

## 2017-11-22 ENCOUNTER — Ambulatory Visit (INDEPENDENT_AMBULATORY_CARE_PROVIDER_SITE_OTHER): Payer: Medicare Other | Admitting: Podiatry

## 2017-11-22 ENCOUNTER — Encounter: Payer: Self-pay | Admitting: Podiatry

## 2017-11-22 DIAGNOSIS — M722 Plantar fascial fibromatosis: Secondary | ICD-10-CM | POA: Diagnosis not present

## 2017-11-22 NOTE — Progress Notes (Signed)
Subjective:   Patient ID: [name], female   DOB: 62 y.o.   MRN: [id number]   HPI Patient states she is feeling much better and has minimal discomfort in her heel at the current time   ROS      Objective:  Physical Exam  Neurovascular status intact with inflammation of the plantar heel left with fluid buildup around the medial band     Assessment:  H&P condition reviewed and at this time I reviewed plantar fasciitis     Plan:  Do not recommend further aggressive treatment but do recommend physical therapy anti-inflammatories ice therapy and continued brace usage.  Reappoint as needed

## 2018-02-18 DIAGNOSIS — E663 Overweight: Secondary | ICD-10-CM | POA: Diagnosis not present

## 2018-02-18 DIAGNOSIS — Z6828 Body mass index (BMI) 28.0-28.9, adult: Secondary | ICD-10-CM | POA: Diagnosis not present

## 2018-02-18 DIAGNOSIS — Z1389 Encounter for screening for other disorder: Secondary | ICD-10-CM | POA: Diagnosis not present

## 2018-02-18 DIAGNOSIS — H109 Unspecified conjunctivitis: Secondary | ICD-10-CM | POA: Diagnosis not present

## 2018-02-22 ENCOUNTER — Other Ambulatory Visit: Payer: Self-pay | Admitting: Cardiology

## 2018-02-22 NOTE — Telephone Encounter (Signed)
REFILL 

## 2018-03-29 DIAGNOSIS — M47816 Spondylosis without myelopathy or radiculopathy, lumbar region: Secondary | ICD-10-CM | POA: Insufficient documentation

## 2018-04-22 ENCOUNTER — Ambulatory Visit (INDEPENDENT_AMBULATORY_CARE_PROVIDER_SITE_OTHER): Payer: 59 | Admitting: Podiatry

## 2018-04-22 ENCOUNTER — Encounter: Payer: Self-pay | Admitting: Podiatry

## 2018-04-22 DIAGNOSIS — M722 Plantar fascial fibromatosis: Secondary | ICD-10-CM

## 2018-04-22 MED ORDER — TRIAMCINOLONE ACETONIDE 10 MG/ML IJ SUSP: 10.0000 mg | Freq: Once | INTRAMUSCULAR | Status: DC

## 2018-04-25 NOTE — Progress Notes (Signed)
Subjective:   Patient ID: [name], female   DOB: 62 y.o.   MRN: [id number]   HPI Patient presents stating that she is still having quite a bit heel pain and is worse after she gets up in the morning or after any periods of sitting   ROS      Objective:  Physical Exam  Neurovascular status unchanged negative Homans sign noted with exquisite discomfort still noted plantar aspect left heel at the insertional point of the tendon into the calcaneus with depression of the arch noted     Assessment:  Continuation of inflammatory fasciitis left heel at the insertion with issues associated with rest and stretching     Plan:  H&P condition reviewed and went ahead and injected the plantar fascial left 3 mg Kenalog 5 mg Xylocaine and applied a night splint in order to completely stretch the plantar foot and advised on aggressive ice therapy.  Patient will be rechecked again in 4 weeks or earlier if needed

## 2018-05-23 ENCOUNTER — Encounter: Payer: Self-pay | Admitting: Podiatry

## 2018-05-23 ENCOUNTER — Ambulatory Visit (INDEPENDENT_AMBULATORY_CARE_PROVIDER_SITE_OTHER): Payer: 59 | Admitting: Podiatry

## 2018-05-23 DIAGNOSIS — M722 Plantar fascial fibromatosis: Secondary | ICD-10-CM | POA: Diagnosis not present

## 2018-05-23 MED ORDER — TRIAMCINOLONE ACETONIDE 10 MG/ML IJ SUSP
10.0000 mg | Freq: Once | INTRAMUSCULAR | Status: AC
  Administered 2018-05-23: 10 mg

## 2018-05-23 NOTE — Progress Notes (Signed)
Subjective:   Patient ID: [name], female   DOB: 63 y.o.   MRN: [id number]   HPI Patient states left heel is still hurting a lot and it seems to move to a different position   ROS      Objective:  Physical Exam  Neurovascular status intact with chronic discomfort in the plantar heel left more lateral now than it is medial with inflammation fluid of the lateral side     Assessment:  Acute plantar fasciitis left lateral side     Plan:  Due to the amount of discomfort she is in failure to respond to stretching exercises I did go ahead today and I dispensed air fracture walker to completely take the weightbearing forces off the plantar heel.  I did do a careful injection of the plantar fascia on the lateral side 3 mg Kenalog 5 mg Xylocaine which was tolerated well and gave instructions on wearing of boot and reappoint 3 weeks

## 2018-06-13 ENCOUNTER — Encounter: Payer: Self-pay | Admitting: Podiatry

## 2018-06-13 ENCOUNTER — Ambulatory Visit (INDEPENDENT_AMBULATORY_CARE_PROVIDER_SITE_OTHER): Payer: 59 | Admitting: Podiatry

## 2018-06-13 DIAGNOSIS — M722 Plantar fascial fibromatosis: Secondary | ICD-10-CM

## 2018-06-14 NOTE — Progress Notes (Signed)
Subjective:   Patient ID: [name], female   DOB: 62 y.o.   MRN: [id number]   HPI Patient presents stating that she is doing well having minimal discomfort in the boot seems to be helping quite a bit   ROS      Objective:  Physical Exam  Neurovascular status intact with inflammation plantar left foot that is improving with no current significant inflammatory discomfort noted     Assessment:  Patient is doing well with chronic tendinitis left     Plan:  Patient will continue boot with gradual weaning off process over the next several weeks and will continue with stretching exercises physical therapy shoe gear modifications and will be rechecked again as needed

## 2018-08-05 DIAGNOSIS — Z23 Encounter for immunization: Secondary | ICD-10-CM | POA: Diagnosis not present

## 2018-09-15 DIAGNOSIS — N3942 Incontinence without sensory awareness: Secondary | ICD-10-CM | POA: Diagnosis not present

## 2018-09-19 DIAGNOSIS — E6609 Other obesity due to excess calories: Secondary | ICD-10-CM | POA: Diagnosis not present

## 2018-09-19 DIAGNOSIS — E119 Type 2 diabetes mellitus without complications: Secondary | ICD-10-CM | POA: Diagnosis not present

## 2018-09-19 DIAGNOSIS — Z1389 Encounter for screening for other disorder: Secondary | ICD-10-CM | POA: Diagnosis not present

## 2018-09-19 DIAGNOSIS — Z6831 Body mass index (BMI) 31.0-31.9, adult: Secondary | ICD-10-CM | POA: Diagnosis not present

## 2018-09-26 DIAGNOSIS — Z6831 Body mass index (BMI) 31.0-31.9, adult: Secondary | ICD-10-CM | POA: Diagnosis not present

## 2018-09-26 DIAGNOSIS — J069 Acute upper respiratory infection, unspecified: Secondary | ICD-10-CM | POA: Diagnosis not present

## 2018-09-26 DIAGNOSIS — E6609 Other obesity due to excess calories: Secondary | ICD-10-CM | POA: Diagnosis not present

## 2018-10-04 DIAGNOSIS — S99912A Unspecified injury of left ankle, initial encounter: Secondary | ICD-10-CM | POA: Diagnosis not present

## 2018-10-04 DIAGNOSIS — M47816 Spondylosis without myelopathy or radiculopathy, lumbar region: Secondary | ICD-10-CM | POA: Diagnosis not present

## 2018-10-12 DIAGNOSIS — Z01419 Encounter for gynecological examination (general) (routine) without abnormal findings: Secondary | ICD-10-CM | POA: Diagnosis not present

## 2018-10-12 DIAGNOSIS — Z6832 Body mass index (BMI) 32.0-32.9, adult: Secondary | ICD-10-CM | POA: Diagnosis not present

## 2018-10-12 DIAGNOSIS — Z1231 Encounter for screening mammogram for malignant neoplasm of breast: Secondary | ICD-10-CM | POA: Diagnosis not present

## 2018-10-20 DIAGNOSIS — R030 Elevated blood-pressure reading, without diagnosis of hypertension: Secondary | ICD-10-CM | POA: Diagnosis not present

## 2018-10-20 DIAGNOSIS — Z6831 Body mass index (BMI) 31.0-31.9, adult: Secondary | ICD-10-CM | POA: Diagnosis not present

## 2018-10-20 DIAGNOSIS — M545 Low back pain: Secondary | ICD-10-CM | POA: Diagnosis not present

## 2018-11-22 DIAGNOSIS — I9589 Other hypotension: Secondary | ICD-10-CM | POA: Diagnosis not present

## 2018-11-22 DIAGNOSIS — Z681 Body mass index (BMI) 19 or less, adult: Secondary | ICD-10-CM | POA: Diagnosis not present

## 2018-11-22 DIAGNOSIS — Z0001 Encounter for general adult medical examination with abnormal findings: Secondary | ICD-10-CM | POA: Diagnosis not present

## 2018-11-22 DIAGNOSIS — G894 Chronic pain syndrome: Secondary | ICD-10-CM | POA: Diagnosis not present

## 2018-11-22 DIAGNOSIS — Z1389 Encounter for screening for other disorder: Secondary | ICD-10-CM | POA: Diagnosis not present

## 2018-11-22 DIAGNOSIS — E7849 Other hyperlipidemia: Secondary | ICD-10-CM | POA: Diagnosis not present

## 2018-11-22 DIAGNOSIS — E663 Overweight: Secondary | ICD-10-CM | POA: Diagnosis not present

## 2018-11-22 DIAGNOSIS — E559 Vitamin D deficiency, unspecified: Secondary | ICD-10-CM | POA: Diagnosis not present

## 2018-11-22 DIAGNOSIS — M5136 Other intervertebral disc degeneration, lumbar region: Secondary | ICD-10-CM | POA: Diagnosis not present

## 2019-01-04 DIAGNOSIS — M25462 Effusion, left knee: Secondary | ICD-10-CM | POA: Diagnosis not present

## 2019-01-04 DIAGNOSIS — R232 Flushing: Secondary | ICD-10-CM | POA: Diagnosis not present

## 2019-01-04 DIAGNOSIS — B001 Herpesviral vesicular dermatitis: Secondary | ICD-10-CM | POA: Diagnosis not present

## 2019-01-04 DIAGNOSIS — H6013 Cellulitis of external ear, bilateral: Secondary | ICD-10-CM | POA: Diagnosis not present

## 2019-01-04 DIAGNOSIS — M1712 Unilateral primary osteoarthritis, left knee: Secondary | ICD-10-CM | POA: Diagnosis not present

## 2019-01-30 ENCOUNTER — Encounter (HOSPITAL_COMMUNITY): Payer: Self-pay | Admitting: Emergency Medicine

## 2019-01-30 ENCOUNTER — Emergency Department (HOSPITAL_COMMUNITY): Payer: PPO

## 2019-01-30 ENCOUNTER — Emergency Department (HOSPITAL_COMMUNITY)
Admission: EM | Admit: 2019-01-30 | Discharge: 2019-01-30 | Disposition: A | Discharge status: Home / Self Care | Payer: PPO | Attending: Emergency Medicine | Admitting: Emergency Medicine

## 2019-01-30 ENCOUNTER — Other Ambulatory Visit: Payer: Self-pay

## 2019-01-30 DIAGNOSIS — K5900 Constipation, unspecified: Secondary | ICD-10-CM | POA: Diagnosis not present

## 2019-01-30 DIAGNOSIS — R1032 Left lower quadrant pain: Secondary | ICD-10-CM | POA: Insufficient documentation

## 2019-01-30 DIAGNOSIS — Z7982 Long term (current) use of aspirin: Secondary | ICD-10-CM | POA: Insufficient documentation

## 2019-01-30 DIAGNOSIS — R1012 Left upper quadrant pain: Secondary | ICD-10-CM | POA: Diagnosis not present

## 2019-01-30 LAB — COMPREHENSIVE METABOLIC PANEL
ALT: 30 U/L (ref 0–44)
AST: 25 U/L (ref 15–41)
Albumin: 4 g/dL (ref 3.5–5.0)
Alkaline Phosphatase: 79 U/L (ref 38–126)
Anion gap: 9 (ref 5–15)
BUN: 17 mg/dL (ref 6–20)
CO2: 24 mmol/L (ref 22–32)
Calcium: 9.1 mg/dL (ref 8.9–10.3)
Chloride: 105 mmol/L (ref 98–111)
Creatinine, Ser: 0.83 mg/dL (ref 0.44–1.00)
GFR calc Af Amer: >60 mL/min (ref >60)
GFR calc non Af Amer: >60 mL/min (ref >60)
Glucose, Bld: 100 mg/dL — ABNORMAL HIGH (ref 70–99)
Potassium: 3.8 mmol/L (ref 3.5–5.1)
Sodium: 138 mmol/L (ref 135–145)
Total Bilirubin: 0.3 mg/dL (ref 0.3–1.2)
Total Protein: 7 g/dL (ref 6.5–8.1)

## 2019-01-30 LAB — CBC WITH DIFFERENTIAL/PLATELET
Abs Immature Granulocytes: 0.03 10*3/uL (ref 0.00–0.07)
Basophils Absolute: 0.1 10*3/uL (ref 0.0–0.1)
Basophils Relative: 1 %
Eosinophils Absolute: 0.2 10*3/uL (ref 0.0–0.5)
Eosinophils Relative: 3 %
HCT: 41.8 % (ref 36.0–46.0)
Hemoglobin: 13.8 g/dL (ref 12.0–15.0)
Immature Granulocytes: 0 %
Lymphocytes Relative: 25 %
Lymphs Abs: 1.7 10*3/uL (ref 0.7–4.0)
MCH: 29.5 pg (ref 26.0–34.0)
MCHC: 33 g/dL (ref 30.0–36.0)
MCV: 89.3 fL (ref 80.0–100.0)
Monocytes Absolute: 0.5 10*3/uL (ref 0.1–1.0)
Monocytes Relative: 7 %
Neutro Abs: 4.5 10*3/uL (ref 1.7–7.7)
Neutrophils Relative %: 64 %
Platelets: 159 10*3/uL (ref 150–400)
RBC: 4.68 MIL/uL (ref 3.87–5.11)
RDW: 13.4 % (ref 11.5–15.5)
WBC: 7 10*3/uL (ref 4.0–10.5)
nRBC: 0 % (ref 0.0–0.2)

## 2019-01-30 LAB — URINALYSIS, ROUTINE W REFLEX MICROSCOPIC
Bilirubin Urine: NEGATIVE
Glucose, UA: NEGATIVE mg/dL
Hgb urine dipstick: NEGATIVE
Ketones, ur: NEGATIVE mg/dL
Leukocytes,Ua: NEGATIVE
Nitrite: NEGATIVE
Protein, ur: NEGATIVE mg/dL
Specific Gravity, Urine: 1.017 (ref 1.005–1.030)
pH: 6 (ref 5.0–8.0)

## 2019-01-30 LAB — LIPASE, BLOOD: Lipase: 25 U/L (ref 11–51)

## 2019-01-30 MED ORDER — DICYCLOMINE HCL 20 MG PO TABS: 20.0000 mg | ORAL_TABLET | Freq: Two times a day (BID) | ORAL | 0 refills | Status: DC | PRN

## 2019-01-30 MED ORDER — IOHEXOL 300 MG/ML  SOLN
100.0000 mL | Freq: Once | INTRAMUSCULAR | Status: AC | PRN
  Administered 2019-01-30: 100 mL via INTRAVENOUS

## 2019-01-30 MED ORDER — ONDANSETRON HCL 4 MG/2ML IJ SOLN
4.0000 mg | Freq: Once | INTRAMUSCULAR | Status: AC
  Administered 2019-01-30: 4 mg via INTRAVENOUS
  Filled 2019-01-30: qty 2

## 2019-01-30 MED ORDER — SODIUM CHLORIDE 0.9 % IV BOLUS
1000.0000 mL | Freq: Once | INTRAVENOUS | Status: AC
  Administered 2019-01-30: 09:00:00 1000 mL via INTRAVENOUS

## 2019-01-30 MED ORDER — MORPHINE SULFATE (PF) 4 MG/ML IV SOLN
4.0000 mg | Freq: Once | INTRAVENOUS | Status: AC
  Administered 2019-01-30: 4 mg via INTRAVENOUS
  Filled 2019-01-30: qty 1

## 2019-01-30 NOTE — ED Provider Notes (Signed)
[facility] Provider Note   CSN: [id number] Arrival date & time: 01/30/19  5852    History   Chief Complaint Chief Complaint  Patient presents with  . Abdominal Pain    HPI [name] is a 63 y.o. female.     Pt presents to the ED today with LLQ abd pain.  Pt said it started last night and became worse early this morning.  She has never had anything like it.  She denies f/c.  No n/v.  She said the pain radiates up her abdomen, but does not go into her back.     Past Medical History:  Diagnosis Date  . Chronic back pain   . Complication of anesthesia    shaking and chattery    Patient Active Problem List   Diagnosis Date Noted  . Lumbar spondylosis 03/29/2018  . Idiopathic hypotension 12/20/2015  . Chronic back pain 09/21/2011    Past Surgical History:  Procedure Laterality Date  . 8 back surgeries    . COLONOSCOPY  10/14/2011   Procedure: COLONOSCOPY;  Surgeon: Rogene Houston, MD;  [facility];  Service: Endoscopy;  Laterality: N/A;  1030  . left foot surgery    . PARTIAL HYSTERECTOMY       OB History   No obstetric history on file.      Home Medications    Prior to Admission medications   Medication Sig Start Date End Date Taking? Authorizing Provider  aspirin EC 81 MG tablet Take 81 mg by mouth daily.    [provider]  CALCIUM PO Take 1 tablet by mouth daily.    [provider]  celecoxib (CELEBREX) 200 MG capsule Take 200 mg by mouth 2 (two) times daily.      [provider]  cholecalciferol (VITAMIN D) 1000 UNITS tablet Take 1,000 Units by mouth 2 (two) times daily.      [provider]  dicyclomine (BENTYL) 20 MG tablet Take 1 tablet (20 mg total) by mouth 2 (two) times daily as needed (abdominal pain). 01/30/19   Isla Pence, MD  docusate sodium (COLACE) 100 MG capsule Take 100-200 mg by mouth 2 (two) times daily. Takes 100 mg in the morning and 200 mg in the evening.     [provider]  HYDROcodone-acetaminophen (NORCO) 10-325 MG tablet Take 1 tablet by mouth every 6 (six) hours as needed for moderate pain.    [provider]  midodrine (PROAMATINE) 5 MG tablet TAKE 2 TABLETS IN THE MORNING, 2 TABLETS AT LUNCH, AND 1 TABLET IN THE EVENING (5 HOURS PRIOR TO BEDTIME). 02/22/18   Minus Breeding, MD  MINIVELLE 0.1 MG/24HR patch Place 1 patch onto the skin 2 (two) times a week. 10/16/15   [provider]  pyridoxine (B-6) 100 MG tablet Take 1 tablet by mouth 2 (two) times a day.    [provider]  tolterodine (DETROL LA) 4 MG 24 hr capsule Take 4 mg by mouth daily.    [provider]  vitamin C (ASCORBIC ACID) 500 MG tablet Take 500 mg by mouth daily.    [provider]    Family History Family History  Problem Relation Age of Onset  . Heart attack Father 45       CABG  . CAD Father   . Prostate cancer Father   . Heart disease Paternal Grandfather     Social History Social History   Tobacco Use  .  Smoking status: Never Smoker  . Smokeless tobacco: Never Used  Substance Use Topics  . Alcohol use: No  . Drug use: No     Allergies   2,4-d dimethylamine (amisol); Balsam; Methylisothiazolinone; Propolis; and Shellac   Review of Systems Review of Systems  Gastrointestinal: Positive for abdominal pain.  All other systems reviewed and are negative.    Physical Exam Updated Vital Signs BP (!) 116/57   Pulse (!) 58   Temp 98.4 F (36.9 C) (Oral)   Resp 16   Ht 5\' 8"  (1.727 m)   Wt 93 kg   SpO2 100%   BMI 31.17 kg/m   Physical Exam Vitals signs and nursing note reviewed.  Constitutional:      Appearance: She is well-developed.  HENT:     Head: Normocephalic and atraumatic.     Mouth/Throat:     Mouth: Mucous membranes are moist.     Pharynx: Oropharynx is clear.  Eyes:     Extraocular Movements: Extraocular movements intact.     Pupils: Pupils are equal, round, and reactive to  light.  Cardiovascular:     Rate and Rhythm: Normal rate and regular rhythm.  Pulmonary:     Effort: Pulmonary effort is normal.     Breath sounds: Normal breath sounds.  Abdominal:     General: Abdomen is flat. Bowel sounds are normal.     Palpations: Abdomen is soft.     Tenderness: There is abdominal tenderness in the left lower quadrant.  Skin:    General: Skin is warm.     Capillary Refill: Capillary refill takes less than 2 seconds.  Neurological:     General: No focal deficit present.     Mental Status: She is alert and oriented to person, place, and time.  Psychiatric:        Mood and Affect: Mood normal.        Behavior: Behavior normal.      ED Treatments / Results  Labs (all labs ordered are listed, but only abnormal results are displayed) Labs Reviewed  COMPREHENSIVE METABOLIC PANEL - Abnormal; Notable for the following components:      Result Value   Glucose, Bld 100 (*)    All other components within normal limits  URINALYSIS, ROUTINE W REFLEX MICROSCOPIC - Abnormal; Notable for the following components:   APPearance HAZY (*)    All other components within normal limits  CBC WITH DIFFERENTIAL/PLATELET  LIPASE, BLOOD    EKG None  Radiology Ct Abdomen Pelvis W Contrast  Result Date: 01/30/2019 CLINICAL DATA:  Left lower quadrant pain radiating to left upper quad EXAM: CT ABDOMEN AND PELVIS WITH CONTRAST TECHNIQUE: Multidetector CT imaging of the abdomen and pelvis was performed using the standard protocol following bolus administration of intravenous contrast. CONTRAST:  163mL OMNIPAQUE IOHEXOL 300 MG/ML  SOLN COMPARISON:  None. FINDINGS: Lower chest: No acute abnormality. Hepatobiliary: No focal liver abnormality is seen. No gallstones, gallbladder wall thickening, or biliary dilatation. Pancreas: Unremarkable. No pancreatic ductal dilatation or surrounding inflammatory changes. Spleen: Normal in size without focal abnormality. Adrenals/Urinary Tract: Adrenal  glands are unremarkable. Kidneys are normal, without renal calculi, focal lesion, or hydronephrosis. Bladder is unremarkable. Stomach/Bowel: Stomach is within normal limits. No evidence of bowel wall thickening, distention, or inflammatory changes. Large amount of stool throughout the colon. Small focal area of inflammatory changes along the left upper quadrant anteriorly which may reflect a small omental infarct. Vascular/Lymphatic: Abdominal aortic atherosclerosis. Normal caliber abdominal aorta. No lymphadenopathy.  Reproductive: Status post hysterectomy. No adnexal masses. Other: Small fat containing umbilical hernia.  No ascites. Musculoskeletal: No acute osseous abnormality. No aggressive osseous lesion. Interbody fusion at L3-4 and L4-5. Prior laminectomy at L4-5. IMPRESSION: 1. Small focal area of inflammatory changes along the left upper quadrant anteriorly which may reflect a small omental infarct. 2. Large amount of stool throughout the colon. Electronically Signed   By: Kathreen Devoid   On: 01/30/2019 10:52    Procedures Procedures (including critical care time)  Medications Ordered in ED Medications  morphine 4 MG/ML injection 4 mg (4 mg Intravenous Given 01/30/19 0926)  ondansetron (ZOFRAN) injection 4 mg (4 mg Intravenous Given 01/30/19 0926)  sodium chloride 0.9 % bolus 1,000 mL (0 mLs Intravenous Stopped 01/30/19 1012)  morphine 4 MG/ML injection 4 mg (4 mg Intravenous Given 01/30/19 1011)  iohexol (OMNIPAQUE) 300 MG/ML solution 100 mL (100 mLs Intravenous Contrast Given 01/30/19 1030)     Initial Impression / Assessment and Plan / ED Course  I have reviewed the triage vital signs and the nursing notes.  Pertinent labs & imaging results that were available during my care of the patient were reviewed by me and considered in my medical decision making (see chart for details).       Pt is feeling better after the above treatment, but still has some pain.  Pain is likely due to  constipation.  She has miralax at home; and is told to use it.  She is given a rx of bentyl to help with the bowel spasms.  Return if worse.  F/u with pcp.  Final Clinical Impressions(s) / ED Diagnoses   Final diagnoses:  Left lower quadrant abdominal pain  Constipation, unspecified constipation type    ED Discharge Orders         Ordered    dicyclomine (BENTYL) 20 MG tablet  2 times daily PRN     01/30/19 1100           Isla Pence, MD 01/30/19 1101

## 2019-01-30 NOTE — Discharge Instructions (Signed)
Take your Miralax to help relieve the constipation.

## 2019-01-30 NOTE — ED Triage Notes (Signed)
Pt reports left sided abd pain since last night.  Taking a deep breath or lying on left side hurts worse.

## 2019-02-16 DIAGNOSIS — Z1389 Encounter for screening for other disorder: Secondary | ICD-10-CM | POA: Diagnosis not present

## 2019-02-16 DIAGNOSIS — E6609 Other obesity due to excess calories: Secondary | ICD-10-CM | POA: Diagnosis not present

## 2019-02-16 DIAGNOSIS — S81852A Open bite, left lower leg, initial encounter: Secondary | ICD-10-CM | POA: Diagnosis not present

## 2019-02-16 DIAGNOSIS — Z6832 Body mass index (BMI) 32.0-32.9, adult: Secondary | ICD-10-CM | POA: Diagnosis not present

## 2019-02-16 DIAGNOSIS — W5581XA Bitten by other mammals, initial encounter: Secondary | ICD-10-CM | POA: Diagnosis not present

## 2019-02-21 ENCOUNTER — Telehealth: Payer: Self-pay | Admitting: Cardiology

## 2019-02-21 NOTE — Telephone Encounter (Signed)
Smartphone/ my chart via email/ consent/ pre reg completd

## 2019-02-22 NOTE — Progress Notes (Signed)
Virtual Visit via Video Note   This visit type was conducted due to national recommendations for restrictions regarding the COVID-19 Pandemic (e.g. social distancing) in an effort to limit this patient's exposure and mitigate transmission in our community.  Due to her co-morbid illnesses, this patient is at least at moderate risk for complications without adequate follow up.  This format is felt to be most appropriate for this patient at this time.  All issues noted in this document were discussed and addressed.  A limited physical exam was performed with this format.  Please refer to the patient's chart for her consent to telehealth for [facility].   Date:  02/23/2019   ID:  ARTEMISIA AUVIL, DOB 06/16/1956, MRN [id number]  Patient Location: Home Provider Location: Home  PCP:  Redmond School, MD  Cardiologist:  Minus Breeding, MD  Electrophysiologist:  None   Evaluation Performed:  Follow-Up Visit  Chief Complaint:  Hypotension   History of Present Illness:    [name] is a 63 y.o. female who presents for follow up of hypotension. She has had low blood pressures with orthostatic symptoms.  She did have cortisol checked and this was normal.   Since I last saw her she has done well.  The patient denies any new symptoms such as chest discomfort, neck or arm discomfort. There has been no new shortness of breath, PND or orthopnea. There have been no reported palpitations, presyncope or syncope.     The patient does not have symptoms concerning for COVID-19 infection (fever, chills, cough, or new shortness of breath).    Past Medical History:  Diagnosis Date  . Chronic back pain   . Complication of anesthesia    shaking and chattery   Past Surgical History:  Procedure Laterality Date  . 8 back surgeries    . COLONOSCOPY  10/14/2011   Procedure: COLONOSCOPY;  Surgeon: Rogene Houston, MD;  [facility];  Service: Endoscopy;  Laterality: N/A;  1030  . left foot  surgery    . PARTIAL HYSTERECTOMY       Current Meds  Medication Sig  . amoxicillin-clavulanate (AUGMENTIN) 875-125 MG tablet Take 1 tablet by mouth 2 (two) times daily.  Marland Kitchen aspirin EC 81 MG tablet Take 81 mg by mouth daily.  . celecoxib (CELEBREX) 200 MG capsule Take 200 mg by mouth 2 (two) times daily.    . cholecalciferol (VITAMIN D) 1000 UNITS tablet Take 1,000 Units by mouth daily.   Marland Kitchen HYDROcodone-acetaminophen (NORCO) 10-325 MG tablet Take 1 tablet by mouth every 6 (six) hours as needed for moderate pain.  . midodrine (PROAMATINE) 5 MG tablet TAKE 2 TABLETS IN THE MORNING, 2 TABLETS AT LUNCH, AND 1 TABLET IN THE EVENING (5 HOURS PRIOR TO BEDTIME).  Marland Kitchen MINIVELLE 0.1 MG/24HR patch Place 1 patch onto the skin 2 (two) times a week.  . tolterodine (DETROL LA) 4 MG 24 hr capsule Take 4 mg by mouth daily.  . vitamin C (ASCORBIC ACID) 500 MG tablet Take 500 mg by mouth daily.   Current Facility-Administered Medications for the 02/23/19 encounter (Telemedicine) with Minus Breeding, MD  Medication  . triamcinolone acetonide (KENALOG) 10 MG/ML injection 10 mg     Allergies:   2,4-d dimethylamine (amisol); Balsam; Methylisothiazolinone; Propolis; and Shellac   Social History   Tobacco Use  . Smoking status: Never Smoker  . Smokeless tobacco: Never Used  Substance Use Topics  . Alcohol use: No  . Drug use: No  Family Hx: The patient's family history includes CAD in her father; Heart attack (age of onset: 2) in her father; Heart disease in her paternal grandfather; Prostate cancer in her father.  ROS:   Please see the history of present illness.    As stated in the HPI and negative for all other systems.   Prior CV studies:   The following studies were reviewed today:  NA  Labs/Other Tests and Data Reviewed:    EKG:  No ECG reviewed.  Recent Labs: 01/30/2019: ALT 30; BUN 17; Creatinine, Ser 0.83; Hemoglobin 13.8; Platelets 159; Potassium 3.8; Sodium 138   Recent Lipid  Panel No results found for: CHOL, TRIG, HDL, CHOLHDL, LDLCALC, LDLDIRECT  Wt Readings from Last 3 Encounters:  02/23/19 199 lb (90.3 kg)  01/30/19 205 lb (93 kg)  09/01/17 203 lb 6.4 oz (92.3 kg)     Objective:    Vital Signs:  BP 102/77   Pulse 83   Ht 5\' 8"  (1.727 m)   Wt 199 lb (90.3 kg)   BMI 30.26 kg/m    VITAL SIGNS:  reviewed GEN:  no acute distress NEURO:  alert and oriented x 3, no obvious focal deficit PSYCH:  normal affect  ASSESSMENT & PLAN:    HYPOTENSION:    She does well with current meds.  No symptoms.  Continue current therapy.      COVID-19 Education: The signs and symptoms of COVID-19 were discussed with the patient and how to seek care for testing (follow up with PCP or arrange E-visit).  The importance of social distancing was discussed today.  Time:   Today, I have spent 16 minutes with the patient with telehealth technology discussing the above problems.     Medication Adjustments/Labs and Tests Ordered: Current medicines are reviewed at length with the patient today.  Concerns regarding medicines are outlined above.   Tests Ordered: No orders of the defined types were placed in this encounter.   Medication Changes: No orders of the defined types were placed in this encounter.   Disposition:  Follow up with me in 18 months  Signed, Minus Breeding, MD  02/23/2019 9:03 AM    [place]

## 2019-02-23 ENCOUNTER — Encounter: Payer: Self-pay | Admitting: Cardiology

## 2019-02-23 ENCOUNTER — Telehealth (INDEPENDENT_AMBULATORY_CARE_PROVIDER_SITE_OTHER): Payer: PPO | Admitting: Cardiology

## 2019-02-23 VITALS — BP 102/77 | HR 83 | Ht 68.0 in | Wt 199.0 lb

## 2019-02-23 DIAGNOSIS — Z7189 Other specified counseling: Secondary | ICD-10-CM | POA: Insufficient documentation

## 2019-02-23 DIAGNOSIS — I959 Hypotension, unspecified: Secondary | ICD-10-CM

## 2019-02-23 NOTE — Patient Instructions (Signed)
Medication Instructions:  Continue current medications  If you need a refill on your cardiac medications before your next appointment, please call your pharmacy.  Labwork: None Ordered   Testing/Procedures: None Ordered  Follow-Up: You will need a follow up appointment in 18 months.  Please call our office 2 months in advance to schedule this appointment.  You may see James Hochrein, MD or one of the following Advanced Practice Providers on your designated Care Team:   Rhonda Barrett, PA-C . Kathryn Lawrence, DNP, ANP    At [facility], you and your health needs are our priority.  As part of our continuing mission to provide you with exceptional heart care, we have created designated Provider Care Teams.  These Care Teams include your primary Cardiologist (physician) and Advanced Practice Providers (APPs -  Physician Assistants and Nurse Practitioners) who all work together to provide you with the care you need, when you need it.   Thank you for choosing [facility]!!     

## 2019-03-09 DIAGNOSIS — M545 Low back pain: Secondary | ICD-10-CM | POA: Diagnosis not present

## 2019-03-25 ENCOUNTER — Other Ambulatory Visit: Payer: Self-pay | Admitting: Cardiology

## 2019-05-02 DIAGNOSIS — Z6833 Body mass index (BMI) 33.0-33.9, adult: Secondary | ICD-10-CM | POA: Diagnosis not present

## 2019-05-02 DIAGNOSIS — F1120 Opioid dependence, uncomplicated: Secondary | ICD-10-CM | POA: Diagnosis not present

## 2019-05-02 DIAGNOSIS — M961 Postlaminectomy syndrome, not elsewhere classified: Secondary | ICD-10-CM | POA: Diagnosis not present

## 2019-05-02 DIAGNOSIS — Z79899 Other long term (current) drug therapy: Secondary | ICD-10-CM | POA: Diagnosis not present

## 2019-05-12 DIAGNOSIS — H00022 Hordeolum internum right lower eyelid: Secondary | ICD-10-CM | POA: Diagnosis not present

## 2019-05-12 DIAGNOSIS — H2511 Age-related nuclear cataract, right eye: Secondary | ICD-10-CM | POA: Diagnosis not present

## 2019-05-22 DIAGNOSIS — E119 Type 2 diabetes mellitus without complications: Secondary | ICD-10-CM | POA: Diagnosis not present

## 2019-05-25 DIAGNOSIS — M545 Low back pain: Secondary | ICD-10-CM | POA: Diagnosis not present

## 2019-06-14 DIAGNOSIS — M47816 Spondylosis without myelopathy or radiculopathy, lumbar region: Secondary | ICD-10-CM | POA: Diagnosis not present

## 2019-06-14 DIAGNOSIS — Z6832 Body mass index (BMI) 32.0-32.9, adult: Secondary | ICD-10-CM | POA: Diagnosis not present

## 2019-06-14 DIAGNOSIS — R030 Elevated blood-pressure reading, without diagnosis of hypertension: Secondary | ICD-10-CM | POA: Diagnosis not present

## 2019-06-28 DIAGNOSIS — H2513 Age-related nuclear cataract, bilateral: Secondary | ICD-10-CM | POA: Diagnosis not present

## 2019-06-28 DIAGNOSIS — Z83511 Family history of glaucoma: Secondary | ICD-10-CM | POA: Diagnosis not present

## 2019-06-28 DIAGNOSIS — H40003 Preglaucoma, unspecified, bilateral: Secondary | ICD-10-CM | POA: Diagnosis not present

## 2019-07-04 DIAGNOSIS — M47816 Spondylosis without myelopathy or radiculopathy, lumbar region: Secondary | ICD-10-CM | POA: Diagnosis not present

## 2019-07-04 DIAGNOSIS — M961 Postlaminectomy syndrome, not elsewhere classified: Secondary | ICD-10-CM | POA: Diagnosis not present

## 2019-07-04 DIAGNOSIS — F1120 Opioid dependence, uncomplicated: Secondary | ICD-10-CM | POA: Diagnosis not present

## 2019-07-04 DIAGNOSIS — Z79899 Other long term (current) drug therapy: Secondary | ICD-10-CM | POA: Diagnosis not present

## 2019-07-18 DIAGNOSIS — H2513 Age-related nuclear cataract, bilateral: Secondary | ICD-10-CM | POA: Diagnosis not present

## 2019-07-18 DIAGNOSIS — H00025 Hordeolum internum left lower eyelid: Secondary | ICD-10-CM | POA: Diagnosis not present

## 2019-09-19 DIAGNOSIS — N3942 Incontinence without sensory awareness: Secondary | ICD-10-CM | POA: Diagnosis not present

## 2019-09-19 DIAGNOSIS — N393 Stress incontinence (female) (male): Secondary | ICD-10-CM | POA: Diagnosis not present

## 2019-10-03 DIAGNOSIS — Z79899 Other long term (current) drug therapy: Secondary | ICD-10-CM | POA: Diagnosis not present

## 2019-10-03 DIAGNOSIS — F1120 Opioid dependence, uncomplicated: Secondary | ICD-10-CM | POA: Diagnosis not present

## 2019-10-03 DIAGNOSIS — M961 Postlaminectomy syndrome, not elsewhere classified: Secondary | ICD-10-CM | POA: Diagnosis not present

## 2019-10-03 DIAGNOSIS — M47816 Spondylosis without myelopathy or radiculopathy, lumbar region: Secondary | ICD-10-CM | POA: Diagnosis not present

## 2019-10-22 ENCOUNTER — Telehealth: Payer: PPO | Admitting: Physician Assistant

## 2019-10-22 DIAGNOSIS — R300 Dysuria: Secondary | ICD-10-CM

## 2019-10-22 MED ORDER — CEPHALEXIN 500 MG PO CAPS: 500.0000 mg | ORAL_CAPSULE | Freq: Two times a day (BID) | ORAL | 0 refills | Status: AC

## 2019-10-22 NOTE — Progress Notes (Signed)
I have spent 5 minutes in review of e-visit questionnaire, review and updating patient chart, medical decision making and response to patient.   Epiphany Seltzer Cody Chioke Noxon, PA-C    

## 2019-10-22 NOTE — Progress Notes (Signed)

## 2019-11-05 DIAGNOSIS — M5136 Other intervertebral disc degeneration, lumbar region: Secondary | ICD-10-CM | POA: Diagnosis not present

## 2019-11-05 DIAGNOSIS — F4542 Pain disorder with related psychological factors: Secondary | ICD-10-CM | POA: Diagnosis not present

## 2019-11-05 DIAGNOSIS — I9589 Other hypotension: Secondary | ICD-10-CM | POA: Diagnosis not present

## 2019-11-05 DIAGNOSIS — G894 Chronic pain syndrome: Secondary | ICD-10-CM | POA: Diagnosis not present

## 2019-12-03 DIAGNOSIS — M5136 Other intervertebral disc degeneration, lumbar region: Secondary | ICD-10-CM | POA: Diagnosis not present

## 2019-12-03 DIAGNOSIS — G894 Chronic pain syndrome: Secondary | ICD-10-CM | POA: Diagnosis not present

## 2019-12-03 DIAGNOSIS — F4542 Pain disorder with related psychological factors: Secondary | ICD-10-CM | POA: Diagnosis not present

## 2019-12-03 DIAGNOSIS — I9589 Other hypotension: Secondary | ICD-10-CM | POA: Diagnosis not present

## 2019-12-28 DIAGNOSIS — Z124 Encounter for screening for malignant neoplasm of cervix: Secondary | ICD-10-CM | POA: Diagnosis not present

## 2019-12-28 DIAGNOSIS — Z1231 Encounter for screening mammogram for malignant neoplasm of breast: Secondary | ICD-10-CM | POA: Diagnosis not present

## 2019-12-28 DIAGNOSIS — Z6834 Body mass index (BMI) 34.0-34.9, adult: Secondary | ICD-10-CM | POA: Diagnosis not present

## 2020-01-01 DIAGNOSIS — Z79899 Other long term (current) drug therapy: Secondary | ICD-10-CM | POA: Diagnosis not present

## 2020-01-01 DIAGNOSIS — M47816 Spondylosis without myelopathy or radiculopathy, lumbar region: Secondary | ICD-10-CM | POA: Diagnosis not present

## 2020-01-01 DIAGNOSIS — M961 Postlaminectomy syndrome, not elsewhere classified: Secondary | ICD-10-CM | POA: Diagnosis not present

## 2020-01-01 DIAGNOSIS — F1120 Opioid dependence, uncomplicated: Secondary | ICD-10-CM | POA: Diagnosis not present

## 2020-01-03 DIAGNOSIS — M5136 Other intervertebral disc degeneration, lumbar region: Secondary | ICD-10-CM | POA: Diagnosis not present

## 2020-01-03 DIAGNOSIS — E7849 Other hyperlipidemia: Secondary | ICD-10-CM | POA: Diagnosis not present

## 2020-01-03 DIAGNOSIS — I9589 Other hypotension: Secondary | ICD-10-CM | POA: Diagnosis not present

## 2020-01-05 ENCOUNTER — Ambulatory Visit
Admission: EM | Admit: 2020-01-05 | Discharge: 2020-01-05 | Disposition: A | Discharge status: Home / Self Care | Payer: PPO | Attending: Emergency Medicine | Admitting: Emergency Medicine

## 2020-01-05 DIAGNOSIS — W57XXXA Bitten or stung by nonvenomous insect and other nonvenomous arthropods, initial encounter: Secondary | ICD-10-CM

## 2020-01-05 DIAGNOSIS — S30861A Insect bite (nonvenomous) of abdominal wall, initial encounter: Secondary | ICD-10-CM | POA: Diagnosis not present

## 2020-01-05 MED ORDER — DOXYCYCLINE HYCLATE 100 MG PO TABS
200.0000 mg | ORAL_TABLET | Freq: Once | ORAL | Status: AC
  Administered 2020-01-05: 200 mg via ORAL

## 2020-01-05 NOTE — Discharge Instructions (Signed)
Remainder of tick removed prophylactic dose of doxycycline 200 mg given in office To prevent tick bites, wear long sleeves, long pants, and light colors. Use insect repellent. Follow the instructions on the bottle. If the tick is biting, do not try to remove it with heat, alcohol, petroleum jelly, or fingernail polish. Use tweezers, curved forceps, or a tick-removal tool to grasp the tick. Gently pull up until the tick lets go. Do not twist or jerk the tick. Do not squeeze or crush the tick. Return here or go to ER if you have any new or worsening symptoms (rash, nausea, vomiting, fever, chills, headache, fatigue)

## 2020-01-05 NOTE — ED Triage Notes (Signed)
Pt presents with complaints of tick bite on right flank. States that she is concerned part of the tick is still there. The area is red about the size of a quarter. Pt has the tick present with her in a bag. Reports the area is still very itchy.

## 2020-01-05 NOTE — ED Provider Notes (Signed)
[place]   IE:[id number] 01/05/20 Arrival Time: [id number]  CC: Tick bite  SUBJECTIVE:  [name] is a 64 y.o. female complains of tick bite that she noticed 3 days ago.  Denies a precipitating event, or recent outdoor activities.   Localizes the bite to her RT abdomen.  Tried removing tick at home, but concerned a part of the tick is still embedded.  Denies fever, chills, nausea, vomiting, headache, dizziness, weakness, fatigue, rash, or abdominal pain, abnormal joint pain, myalgias.   ROS: As per HPI.  All other pertinent ROS negative.     Past Medical History:  Diagnosis Date  . Chronic back pain   . Complication of anesthesia    shaking and chattery   Past Surgical History:  Procedure Laterality Date  . 8 back surgeries    . COLONOSCOPY  10/14/2011   Procedure: COLONOSCOPY;  Surgeon: Rogene Houston, MD;  [facility];  Service: Endoscopy;  Laterality: N/A;  1030  . left foot surgery    . PARTIAL HYSTERECTOMY     Allergies  Allergen Reactions  . 2,4-D Dimethylamine (Amisol) Rash    Positive in patch testing Fragrance mix 1 and cinnamic aldehyde Positive in patch testing Fragrance mix 1 and cinnamic aldehyde  Positive in patch testing Fragrance mix 1 and cinnamic aldehyde  . Balsam Rash    Positive in patch testing  Positive in patch testing    . Methylisothiazolinone Rash    Positive in patch testing  Positive in patch testing    . Propolis Rash    Positive in patch testing  Positive in patch testing    . Shellac Rash    Positive in patch testing  Positive in patch testing     Current Facility-Administered Medications on File Prior to Encounter  Medication Dose Route Frequency Provider Last Rate Last Admin  . triamcinolone acetonide (KENALOG) 10 MG/ML injection 10 mg  10 mg Other Once Wallene Huh, DPM       Current Outpatient Medications on File Prior to Encounter  Medication Sig Dispense Refill  . aspirin EC 81 MG tablet Take 81 mg  by mouth daily.    . celecoxib (CELEBREX) 200 MG capsule Take 200 mg by mouth 2 (two) times daily.      . cholecalciferol (VITAMIN D) 1000 UNITS tablet Take 1,000 Units by mouth daily.     Marland Kitchen HYDROcodone-acetaminophen (NORCO) 10-325 MG tablet Take 1 tablet by mouth every 6 (six) hours as needed for moderate pain.    . midodrine (PROAMATINE) 5 MG tablet TAKE 2 TABLETS IN THE MORNING, 2 TABLETS AT LUNCH, AND 1 TABLET IN THE EVENING (5 HOURS PRIOR TO BEDTIME). 150 tablet 6  . MINIVELLE 0.1 MG/24HR patch Place 1 patch onto the skin 2 (two) times a week.    . tolterodine (DETROL LA) 4 MG 24 hr capsule Take 4 mg by mouth daily.    . vitamin C (ASCORBIC ACID) 500 MG tablet Take 500 mg by mouth daily.     Social History   Socioeconomic History  . Marital status: Married    Spouse name: Not on file  . Number of children: 2  . Years of education: Not on file  . Highest education level: Not on file  Occupational History  . Not on file  Tobacco Use  . Smoking status: Never Smoker  . Smokeless tobacco: Never Used  Substance and Sexual Activity  . Alcohol use: No  . Drug use:  No  . Sexual activity: Not on file  Other Topics Concern  . Not on file  Social History Narrative   Lives at home with husband.     Social Determinants of Health   Financial Resource Strain:   . Difficulty of Paying Living Expenses:   Food Insecurity:   . Worried About Charity fundraiser in the Last Year:   . Arboriculturist in the Last Year:   Transportation Needs:   . Film/video editor (Medical):   Marland Kitchen Lack of Transportation (Non-Medical):   Physical Activity:   . Days of Exercise per Week:   . Minutes of Exercise per Session:   Stress:   . Feeling of Stress :   Social Connections:   . Frequency of Communication with Friends and Family:   . Frequency of Social Gatherings with Friends and Family:   . Attends Religious Services:   . Active Member of Clubs or Organizations:   . Attends Archivist  Meetings:   Marland Kitchen Marital Status:   Intimate Partner Violence:   . Fear of Current or Ex-Partner:   . Emotionally Abused:   Marland Kitchen Physically Abused:   . Sexually Abused:    Family History  Problem Relation Age of Onset  . Heart attack Father 81       CABG  . CAD Father   . Prostate cancer Father   . Heart disease Paternal Grandfather     OBJECTIVE: Vitals:   01/05/20 1352  BP: 107/74  Pulse: 69  Resp: 18  Temp: 97.9 F (36.6 C)  SpO2: 97%    General appearance: alert; no distress Head: NCAT ENT: PERRL, EOMI grossly; oropharynx clear Lungs: clear to auscultation bilaterally Heart: regular rate and rhythm.   Extremities: no edema Skin: warm and dry; small black particle without identifiable markings identified on the RT lateral abdomen.  Area was cleaned with alcohol swab.  Particle removed completely with forceps and disposed of.  Small punctate lesion with mild surrounding erythema appreciated after removal.  Band-aid placed. Psychological: alert and cooperative; normal mood and affect  ASSESSMENT & PLAN:  1. Tick bite of abdomen, initial encounter    Meds ordered this encounter  Medications  . doxycycline (VIBRA-TABS) tablet 200 mg   Remainder of tick removed prophylactic dose of doxycycline 200 mg given in office To prevent tick bites, wear long sleeves, long pants, and light colors. Use insect repellent. Follow the instructions on the bottle. If the tick is biting, do not try to remove it with heat, alcohol, petroleum jelly, or fingernail polish. Use tweezers, curved forceps, or a tick-removal tool to grasp the tick. Gently pull up until the tick lets go. Do not twist or jerk the tick. Do not squeeze or crush the tick. Return here or go to ER if you have any new or worsening symptoms (rash, nausea, vomiting, fever, chills, headache, fatigue)  Reviewed expectations re: course of current medical issues. Questions answered. Outlined signs and symptoms indicating need for  more acute intervention. Patient verbalized understanding. After Visit Summary given.   Lestine Box, PA-C 01/05/20 1430

## 2020-01-11 ENCOUNTER — Other Ambulatory Visit: Payer: Self-pay | Admitting: Cardiology

## 2020-01-15 DIAGNOSIS — H2513 Age-related nuclear cataract, bilateral: Secondary | ICD-10-CM | POA: Diagnosis not present

## 2020-01-15 DIAGNOSIS — H43313 Vitreous membranes and strands, bilateral: Secondary | ICD-10-CM | POA: Diagnosis not present

## 2020-02-02 DIAGNOSIS — E7849 Other hyperlipidemia: Secondary | ICD-10-CM | POA: Diagnosis not present

## 2020-02-02 DIAGNOSIS — F4542 Pain disorder with related psychological factors: Secondary | ICD-10-CM | POA: Diagnosis not present

## 2020-02-02 DIAGNOSIS — M5136 Other intervertebral disc degeneration, lumbar region: Secondary | ICD-10-CM | POA: Diagnosis not present

## 2020-02-02 DIAGNOSIS — G894 Chronic pain syndrome: Secondary | ICD-10-CM | POA: Diagnosis not present

## 2020-02-02 DIAGNOSIS — I9589 Other hypotension: Secondary | ICD-10-CM | POA: Diagnosis not present

## 2020-02-21 DIAGNOSIS — H2513 Age-related nuclear cataract, bilateral: Secondary | ICD-10-CM | POA: Diagnosis not present

## 2020-02-21 DIAGNOSIS — H43312 Vitreous membranes and strands, left eye: Secondary | ICD-10-CM | POA: Diagnosis not present

## 2020-02-21 DIAGNOSIS — H40003 Preglaucoma, unspecified, bilateral: Secondary | ICD-10-CM | POA: Diagnosis not present

## 2020-02-29 DIAGNOSIS — H2511 Age-related nuclear cataract, right eye: Secondary | ICD-10-CM | POA: Diagnosis not present

## 2020-02-29 DIAGNOSIS — H43311 Vitreous membranes and strands, right eye: Secondary | ICD-10-CM | POA: Diagnosis not present

## 2020-03-04 DIAGNOSIS — M5136 Other intervertebral disc degeneration, lumbar region: Secondary | ICD-10-CM | POA: Diagnosis not present

## 2020-03-04 DIAGNOSIS — I9589 Other hypotension: Secondary | ICD-10-CM | POA: Diagnosis not present

## 2020-03-04 DIAGNOSIS — G894 Chronic pain syndrome: Secondary | ICD-10-CM | POA: Diagnosis not present

## 2020-03-04 DIAGNOSIS — E7849 Other hyperlipidemia: Secondary | ICD-10-CM | POA: Diagnosis not present

## 2020-03-04 DIAGNOSIS — F4542 Pain disorder with related psychological factors: Secondary | ICD-10-CM | POA: Diagnosis not present

## 2020-04-01 DIAGNOSIS — Z79899 Other long term (current) drug therapy: Secondary | ICD-10-CM | POA: Diagnosis not present

## 2020-04-01 DIAGNOSIS — M961 Postlaminectomy syndrome, not elsewhere classified: Secondary | ICD-10-CM | POA: Diagnosis not present

## 2020-04-01 DIAGNOSIS — M47816 Spondylosis without myelopathy or radiculopathy, lumbar region: Secondary | ICD-10-CM | POA: Diagnosis not present

## 2020-04-01 DIAGNOSIS — F1120 Opioid dependence, uncomplicated: Secondary | ICD-10-CM | POA: Diagnosis not present

## 2020-04-03 DIAGNOSIS — M5136 Other intervertebral disc degeneration, lumbar region: Secondary | ICD-10-CM | POA: Diagnosis not present

## 2020-04-03 DIAGNOSIS — I9589 Other hypotension: Secondary | ICD-10-CM | POA: Diagnosis not present

## 2020-04-03 DIAGNOSIS — F4542 Pain disorder with related psychological factors: Secondary | ICD-10-CM | POA: Diagnosis not present

## 2020-04-03 DIAGNOSIS — E7849 Other hyperlipidemia: Secondary | ICD-10-CM | POA: Diagnosis not present

## 2020-04-03 DIAGNOSIS — G894 Chronic pain syndrome: Secondary | ICD-10-CM | POA: Diagnosis not present

## 2020-04-12 DIAGNOSIS — R351 Nocturia: Secondary | ICD-10-CM | POA: Diagnosis not present

## 2020-04-12 DIAGNOSIS — Z6826 Body mass index (BMI) 26.0-26.9, adult: Secondary | ICD-10-CM | POA: Diagnosis not present

## 2020-04-12 DIAGNOSIS — E7849 Other hyperlipidemia: Secondary | ICD-10-CM | POA: Diagnosis not present

## 2020-04-12 DIAGNOSIS — E663 Overweight: Secondary | ICD-10-CM | POA: Diagnosis not present

## 2020-04-12 DIAGNOSIS — M47816 Spondylosis without myelopathy or radiculopathy, lumbar region: Secondary | ICD-10-CM | POA: Diagnosis not present

## 2020-04-12 DIAGNOSIS — G894 Chronic pain syndrome: Secondary | ICD-10-CM | POA: Diagnosis not present

## 2020-04-12 DIAGNOSIS — E559 Vitamin D deficiency, unspecified: Secondary | ICD-10-CM | POA: Diagnosis not present

## 2020-04-12 DIAGNOSIS — Z0000 Encounter for general adult medical examination without abnormal findings: Secondary | ICD-10-CM | POA: Diagnosis not present

## 2020-04-12 DIAGNOSIS — I9589 Other hypotension: Secondary | ICD-10-CM | POA: Diagnosis not present

## 2020-04-12 DIAGNOSIS — M5136 Other intervertebral disc degeneration, lumbar region: Secondary | ICD-10-CM | POA: Diagnosis not present

## 2020-05-03 DIAGNOSIS — F4542 Pain disorder with related psychological factors: Secondary | ICD-10-CM | POA: Diagnosis not present

## 2020-05-03 DIAGNOSIS — G894 Chronic pain syndrome: Secondary | ICD-10-CM | POA: Diagnosis not present

## 2020-05-03 DIAGNOSIS — M5136 Other intervertebral disc degeneration, lumbar region: Secondary | ICD-10-CM | POA: Diagnosis not present

## 2020-05-03 DIAGNOSIS — E7849 Other hyperlipidemia: Secondary | ICD-10-CM | POA: Diagnosis not present

## 2020-05-03 DIAGNOSIS — I9589 Other hypotension: Secondary | ICD-10-CM | POA: Diagnosis not present

## 2020-06-01 ENCOUNTER — Telehealth: Payer: PPO | Admitting: Physician Assistant

## 2020-06-01 ENCOUNTER — Encounter: Payer: Self-pay | Admitting: Physician Assistant

## 2020-06-01 DIAGNOSIS — U071 COVID-19: Secondary | ICD-10-CM

## 2020-06-01 DIAGNOSIS — J019 Acute sinusitis, unspecified: Secondary | ICD-10-CM

## 2020-06-01 MED ORDER — AMOXICILLIN-POT CLAVULANATE 875-125 MG PO TABS: 1.0000 | ORAL_TABLET | Freq: Two times a day (BID) | ORAL | 0 refills | Status: DC

## 2020-06-01 NOTE — Progress Notes (Signed)
We are sorry that you are not feeling well.  Here is how we plan to help!  Based on what you have shared with me it looks like you have sinusitis.  Sinusitis is inflammation and infection in the sinus cavities of the head.  Based on your presentation I believe you most likely have Acute Viral Sinusitis.This is an infection most likely caused by a virus. There is not specific treatment for viral sinusitis other than to help you with the symptoms until the infection runs its course.    However, due to severity and worsening of your symptoms, I have prescribed antibiotics : Augmentin 500 one pill twice daily for 7 days.  You may use an oral decongestant such as Mucinex D or if you have glaucoma or high blood pressure use plain Mucinex. Saline nasal spray help and can safely be used as often as needed for congestion, I have  prescribed: Azelastine nasal spray 2 sprays in each nostril twice a day  Some authorities believe that zinc sprays or the use of Echinacea may shorten the course of your symptoms.   You have Covid 19 so please quarantine.   Sinus infections are not as easily transmitted as other respiratory infection, however we still recommend that you avoid close contact with loved ones, especially the very young and elderly.  Remember to wash your hands thoroughly throughout the day as this is the number one way to prevent the spread of infection!  Home Care:  Only take medications as instructed by your medical team.  Do not take these medications with alcohol.  A steam or ultrasonic humidifier can help congestion.  You can place a towel over your head and breathe in the steam from hot water coming from a faucet.  Avoid close contacts especially the very young and the elderly.  Cover your mouth when you cough or sneeze.  Always remember to wash your hands.  Get Help Right Away If:  You develop worsening fever or sinus pain.  You develop a severe head ache or visual  changes.  Your symptoms persist after you have completed your treatment plan.  Make sure you  Understand these instructions.  Will watch your condition.  Will get help right away if you are not doing well or get worse.  Your e-visit answers were reviewed by a board certified advanced clinical practitioner to complete your personal care plan.  Depending on the condition, your plan could have included both over the counter or prescription medications.  If there is a problem please reply  once you have received a response from your provider.  Your safety is important to [place].  If you have drug allergies check your prescription carefully.    You can use MyChart to ask questions about today's visit, request a non-urgent call back, or ask for a work or school excuse for 24 hours related to this e-Visit. If it has been greater than 24 hours you will need to follow up with your provider, or enter a new e-Visit to address those concerns.  You will get an e-mail in the next two days asking about your experience.  I hope that your e-visit has been valuable and will speed your recovery. Thank you for using e-visits.   I spent 5-10 minutes on review and completion of this note- Lacy Duverney [facility]

## 2020-06-02 NOTE — Addendum Note (Signed)
Addended by: Waldon Merl on: 06/02/2020 07:22 PM   Modules accepted: Orders

## 2020-06-04 DIAGNOSIS — I9589 Other hypotension: Secondary | ICD-10-CM | POA: Diagnosis not present

## 2020-06-04 DIAGNOSIS — M5136 Other intervertebral disc degeneration, lumbar region: Secondary | ICD-10-CM | POA: Diagnosis not present

## 2020-06-04 DIAGNOSIS — E7849 Other hyperlipidemia: Secondary | ICD-10-CM | POA: Diagnosis not present

## 2020-06-05 ENCOUNTER — Emergency Department (HOSPITAL_COMMUNITY)
Admission: EM | Admit: 2020-06-05 | Discharge: 2020-06-06 | Disposition: A | Discharge status: Home / Self Care | Payer: PPO | Attending: Emergency Medicine | Admitting: Emergency Medicine

## 2020-06-05 ENCOUNTER — Other Ambulatory Visit: Payer: Self-pay

## 2020-06-05 ENCOUNTER — Encounter (HOSPITAL_COMMUNITY): Payer: Self-pay | Admitting: Emergency Medicine

## 2020-06-05 DIAGNOSIS — R0689 Other abnormalities of breathing: Secondary | ICD-10-CM | POA: Diagnosis not present

## 2020-06-05 DIAGNOSIS — Z7982 Long term (current) use of aspirin: Secondary | ICD-10-CM | POA: Diagnosis not present

## 2020-06-05 DIAGNOSIS — R531 Weakness: Secondary | ICD-10-CM | POA: Diagnosis not present

## 2020-06-05 DIAGNOSIS — R0602 Shortness of breath: Secondary | ICD-10-CM

## 2020-06-05 DIAGNOSIS — U071 COVID-19: Secondary | ICD-10-CM | POA: Insufficient documentation

## 2020-06-05 DIAGNOSIS — R519 Headache, unspecified: Secondary | ICD-10-CM

## 2020-06-05 DIAGNOSIS — Z79899 Other long term (current) drug therapy: Secondary | ICD-10-CM | POA: Diagnosis not present

## 2020-06-05 DIAGNOSIS — Z743 Need for continuous supervision: Secondary | ICD-10-CM | POA: Diagnosis not present

## 2020-06-05 DIAGNOSIS — R110 Nausea: Secondary | ICD-10-CM | POA: Diagnosis not present

## 2020-06-05 DIAGNOSIS — R42 Dizziness and giddiness: Secondary | ICD-10-CM | POA: Diagnosis not present

## 2020-06-05 DIAGNOSIS — I499 Cardiac arrhythmia, unspecified: Secondary | ICD-10-CM | POA: Diagnosis not present

## 2020-06-05 NOTE — ED Triage Notes (Signed)
Pt c/o increased sob and feels weak.

## 2020-06-06 ENCOUNTER — Emergency Department (HOSPITAL_COMMUNITY): Payer: PPO

## 2020-06-06 DIAGNOSIS — R531 Weakness: Secondary | ICD-10-CM | POA: Diagnosis not present

## 2020-06-06 LAB — COMPREHENSIVE METABOLIC PANEL
ALT: 42 U/L (ref 0–44)
AST: 28 U/L (ref 15–41)
Albumin: 3.8 g/dL (ref 3.5–5.0)
Alkaline Phosphatase: 85 U/L (ref 38–126)
Anion gap: 10 (ref 5–15)
BUN: 24 mg/dL — ABNORMAL HIGH (ref 8–23)
CO2: 25 mmol/L (ref 22–32)
Calcium: 9 mg/dL (ref 8.9–10.3)
Chloride: 103 mmol/L (ref 98–111)
Creatinine, Ser: 0.71 mg/dL (ref 0.44–1.00)
GFR calc Af Amer: >60 mL/min (ref >60)
GFR calc non Af Amer: >60 mL/min (ref >60)
Glucose, Bld: 105 mg/dL — ABNORMAL HIGH (ref 70–99)
Potassium: 3.9 mmol/L (ref 3.5–5.1)
Sodium: 138 mmol/L (ref 135–145)
Total Bilirubin: 0.5 mg/dL (ref 0.3–1.2)
Total Protein: 7.1 g/dL (ref 6.5–8.1)

## 2020-06-06 LAB — CBC WITH DIFFERENTIAL/PLATELET
Abs Immature Granulocytes: 0.02 10*3/uL (ref 0.00–0.07)
Basophils Absolute: 0 10*3/uL (ref 0.0–0.1)
Basophils Relative: 0 %
Eosinophils Absolute: 0 10*3/uL (ref 0.0–0.5)
Eosinophils Relative: 0 %
HCT: 44.4 % (ref 36.0–46.0)
Hemoglobin: 14.5 g/dL (ref 12.0–15.0)
Immature Granulocytes: 0 %
Lymphocytes Relative: 20 %
Lymphs Abs: 1.1 10*3/uL (ref 0.7–4.0)
MCH: 29.1 pg (ref 26.0–34.0)
MCHC: 32.7 g/dL (ref 30.0–36.0)
MCV: 89.2 fL (ref 80.0–100.0)
Monocytes Absolute: 0.5 10*3/uL (ref 0.1–1.0)
Monocytes Relative: 9 %
Neutro Abs: 3.7 10*3/uL (ref 1.7–7.7)
Neutrophils Relative %: 71 %
Platelets: 121 10*3/uL — ABNORMAL LOW (ref 150–400)
RBC: 4.98 MIL/uL (ref 3.87–5.11)
RDW: 13.8 % (ref 11.5–15.5)
WBC: 5.3 10*3/uL (ref 4.0–10.5)
nRBC: 0 % (ref 0.0–0.2)

## 2020-06-06 MED ORDER — MIDODRINE HCL 5 MG PO TABS
5.0000 mg | ORAL_TABLET | Freq: Once | ORAL | Status: AC
  Administered 2020-06-06: 5 mg via ORAL
  Filled 2020-06-06 (×2): qty 1

## 2020-06-06 MED ORDER — LACTATED RINGERS IV BOLUS
1000.0000 mL | Freq: Once | INTRAVENOUS | Status: AC
  Administered 2020-06-06: 1000 mL via INTRAVENOUS

## 2020-06-06 MED ORDER — ACETAMINOPHEN 500 MG PO TABS
1000.0000 mg | ORAL_TABLET | Freq: Once | ORAL | Status: AC
  Administered 2020-06-06: 1000 mg via ORAL
  Filled 2020-06-06: qty 2

## 2020-06-06 NOTE — ED Provider Notes (Signed)
[facility] EMERGENCY DEPARTMENT Provider Note   CSN: [id number] Arrival date & time: 06/05/20  1947     History Chief Complaint  Patient presents with   Shortness of Breath   Covid+    [name] is a 64 y.o. female.   Shortness of Breath Severity:  Moderate Onset quality:  Gradual Duration:  3 days Timing:  Constant Progression:  Worsening Chronicity:  New Context: not activity   Ineffective treatments: albuterol. Associated symptoms: cough        Past Medical History:  Diagnosis Date   Chronic back pain    Complication of anesthesia    shaking and chattery    Patient Active Problem List   Diagnosis Date Noted   Educated about COVID-19 virus infection 02/23/2019   Lumbar spondylosis 03/29/2018   Idiopathic hypotension 12/20/2015   Chronic back pain 09/21/2011    Past Surgical History:  Procedure Laterality Date   8 back surgeries     COLONOSCOPY  10/14/2011   Procedure: COLONOSCOPY;  Surgeon: Rogene Houston, MD;  Location: [facility];  Service: Endoscopy;  Laterality: N/A;  1030   left foot surgery     PARTIAL HYSTERECTOMY       OB History   No obstetric history on file.     Family History  Problem Relation Age of Onset   Heart attack Father 11       CABG   CAD Father    Prostate cancer Father    Heart disease Paternal Grandfather     Social History   Tobacco Use   Smoking status: Never Smoker   Smokeless tobacco: Never Used  Substance Use Topics   Alcohol use: No   Drug use: No    Home Medications Prior to Admission medications   Medication Sig Start Date End Date Taking? Authorizing Provider  amoxicillin-clavulanate (AUGMENTIN) 875-125 MG tablet Take 1 tablet by mouth 2 (two) times daily. 06/01/20   Waldon Merl, PA-C  aspirin EC 81 MG tablet Take 81 mg by mouth daily.    [provider]  celecoxib (CELEBREX) 200 MG capsule Take 200 mg by mouth 2 (two) times daily.      [provider]   cholecalciferol (VITAMIN D) 1000 UNITS tablet Take 1,000 Units by mouth daily.     [provider]  HYDROcodone-acetaminophen (NORCO) 10-325 MG tablet Take 1 tablet by mouth every 6 (six) hours as needed for moderate pain.    [provider]  midodrine (PROAMATINE) 5 MG tablet TAKE 2 TABLETS IN THE MORNING, 2 TABLETS AT LUNCH, AND 1 TABLET IN THE EVENING (5 HOURS PRIOR TO BEDTIME). 01/12/20   Minus Breeding, MD  MINIVELLE 0.1 MG/24HR patch Place 1 patch onto the skin 2 (two) times a week. 10/16/15   [provider]  tolterodine (DETROL LA) 4 MG 24 hr capsule Take 4 mg by mouth daily.    [provider]  vitamin C (ASCORBIC ACID) 500 MG tablet Take 500 mg by mouth daily.    [provider]    Allergies    2,4-d dimethylamine (amisol); Balsam; Methylisothiazolinone; Propolis; and Shellac  Review of Systems   Review of Systems  Respiratory: Positive for cough and shortness of breath.   All other systems reviewed and are negative.   Physical Exam Updated Vital Signs BP (!) 102/91    Pulse 72    Temp 98.6 F (37 C)    Resp (!) 32    Ht 5'  8" (1.727 m)    Wt 71.7 kg    SpO2 100%    BMI 24.02 kg/m   Physical Exam Vitals and nursing note reviewed.  Constitutional:      Appearance: She is well-developed.  HENT:     Head: Normocephalic and atraumatic.     Nose: Nose normal. No congestion or rhinorrhea.     Mouth/Throat:     Mouth: Mucous membranes are moist.     Pharynx: Oropharynx is clear.  Eyes:     Pupils: Pupils are equal, round, and reactive to light.  Cardiovascular:     Rate and Rhythm: Normal rate and regular rhythm.  Pulmonary:     Effort: Tachypnea present. No accessory muscle usage or respiratory distress.     Breath sounds: No stridor. Wheezing present. No decreased breath sounds or rhonchi.  Abdominal:     General: There is no distension.  Musculoskeletal:        General: No swelling or tenderness. Normal range of motion.      Cervical back: Normal range of motion.  Skin:    General: Skin is warm and dry.     Coloration: Skin is not jaundiced.  Neurological:     General: No focal deficit present.     Mental Status: She is alert.     ED Results / Procedures / Treatments   Labs (all labs ordered are listed, but only abnormal results are displayed) Labs Reviewed  CBC WITH DIFFERENTIAL/PLATELET  COMPREHENSIVE METABOLIC PANEL  URINALYSIS, ROUTINE W REFLEX MICROSCOPIC    EKG None  Radiology DG Chest Portable 1 View  Result Date: 06/06/2020 CLINICAL DATA:  COVID-19 positive, increasing shortness of breath, weakness EXAM: PORTABLE CHEST 1 VIEW COMPARISON:  02/14/2009 FINDINGS: Single frontal view of the chest demonstrates an unremarkable cardiac silhouette. Spinal stimulator overlies the lower thoracic central canal. No airspace disease, effusion, or pneumothorax. No acute bony abnormalities. IMPRESSION: 1. No acute intrathoracic process. Electronically Signed   By: Randa Ngo M.D.   On: 06/06/2020 01:10    Procedures Procedures (including critical care time)  Medications Ordered in ED Medications  lactated ringers bolus 1,000 mL (1,000 mLs Intravenous New Bag/Given 06/06/20 0032)  midodrine (PROAMATINE) tablet 5 mg (5 mg Oral Given 06/06/20 0106)    ED Course  I have reviewed the triage vital signs and the nursing notes.  Pertinent labs & imaging results that were available during my care of the patient were reviewed by me and considered in my medical decision making (see chart for details).    MDM Rules/Calculators/A&P                         Covid. Worsening dyspnea, light headedness. Overall appears well otherwise, slightly tachy, will give fluids. Has been trying inhaler.  Significant improvement with all symptoms. Orthostatics unremarkable after fluids. Persistently normal o2. Stable for discharge.   Final Clinical Impression(s) / ED Diagnoses Final diagnoses:  None    Rx / DC Orders ED  Discharge Orders    None       Claxton Levitz, Corene Cornea, MD 06/06/20 [phone]

## 2020-06-17 ENCOUNTER — Telehealth: Payer: Self-pay | Admitting: Cardiology

## 2020-06-17 NOTE — Telephone Encounter (Signed)
Returned the call to the patient. She stated that she has been having lower blood pressures and feelings of light headedness since she was diagnosed with Covid on 05/29/20. Her PCP had increased her Midodrine to 10 mg three times daily but this has not helped.  Her blood pressure this morning when she first woke up was 111/75. And then while on the phone (2 hours) after her first dose of Midodrine it was 108/62 with a heart rate of 62.   She has been adding salt to her diet and drinks one 20 oz Gatorade zero a day to try and help. She has been advised to not drive due to her light headedness.

## 2020-06-17 NOTE — Telephone Encounter (Signed)
Pt c/o BP issue: STAT if pt c/o blurred vision, one-sided weakness or slurred speech  1. What are your last 5 BP readings?  06/17/20: 97/63 111/70  06/16/20: 107/63 104/69 98/71  2. Are you having any other symptoms (ex. Dizziness, headache, blurred vision, passed out)? Headaches, lightheadedness  3. What is your BP issue? BP has been extremely low, causing lightheadedness.    STAT if patient feels like he/she is going to faint   1) Are you dizzy now? No  2) Do you feel faint or have you passed out? No  3) Do you have any other symptoms? No

## 2020-06-20 NOTE — Telephone Encounter (Signed)
Left message to call back  

## 2020-06-20 NOTE — Telephone Encounter (Signed)
Looks like she could continue with current therapy.

## 2020-06-21 MED ORDER — MIDODRINE HCL 10 MG PO TABS: 10.0000 mg | ORAL_TABLET | Freq: Three times a day (TID) | ORAL | 3 refills | Status: DC

## 2020-06-21 NOTE — Telephone Encounter (Signed)
Advised patient of recommendations, refilled Midodrine at 10 mg three times a day

## 2020-06-21 NOTE — Telephone Encounter (Signed)
Patient returned call, she said you can leave a detailed message on VM.

## 2020-06-21 NOTE — Telephone Encounter (Signed)
Left message to call back  

## 2020-06-27 DIAGNOSIS — Z79899 Other long term (current) drug therapy: Secondary | ICD-10-CM | POA: Diagnosis not present

## 2020-06-27 DIAGNOSIS — F1120 Opioid dependence, uncomplicated: Secondary | ICD-10-CM | POA: Diagnosis not present

## 2020-06-27 DIAGNOSIS — M47816 Spondylosis without myelopathy or radiculopathy, lumbar region: Secondary | ICD-10-CM | POA: Diagnosis not present

## 2020-06-27 DIAGNOSIS — M961 Postlaminectomy syndrome, not elsewhere classified: Secondary | ICD-10-CM | POA: Diagnosis not present

## 2020-07-04 DIAGNOSIS — G894 Chronic pain syndrome: Secondary | ICD-10-CM | POA: Diagnosis not present

## 2020-07-04 DIAGNOSIS — F4542 Pain disorder with related psychological factors: Secondary | ICD-10-CM | POA: Diagnosis not present

## 2020-07-04 DIAGNOSIS — M5136 Other intervertebral disc degeneration, lumbar region: Secondary | ICD-10-CM | POA: Diagnosis not present

## 2020-07-04 DIAGNOSIS — I9589 Other hypotension: Secondary | ICD-10-CM | POA: Diagnosis not present

## 2020-09-03 DIAGNOSIS — F4542 Pain disorder with related psychological factors: Secondary | ICD-10-CM | POA: Diagnosis not present

## 2020-09-03 DIAGNOSIS — G894 Chronic pain syndrome: Secondary | ICD-10-CM | POA: Diagnosis not present

## 2020-09-03 DIAGNOSIS — M5136 Other intervertebral disc degeneration, lumbar region: Secondary | ICD-10-CM | POA: Diagnosis not present

## 2020-09-03 DIAGNOSIS — I9589 Other hypotension: Secondary | ICD-10-CM | POA: Diagnosis not present

## 2020-09-17 DIAGNOSIS — N3942 Incontinence without sensory awareness: Secondary | ICD-10-CM | POA: Diagnosis not present

## 2020-09-17 DIAGNOSIS — R350 Frequency of micturition: Secondary | ICD-10-CM | POA: Diagnosis not present

## 2020-09-23 DIAGNOSIS — Z79899 Other long term (current) drug therapy: Secondary | ICD-10-CM | POA: Diagnosis not present

## 2020-09-23 DIAGNOSIS — M47816 Spondylosis without myelopathy or radiculopathy, lumbar region: Secondary | ICD-10-CM | POA: Diagnosis not present

## 2020-09-23 DIAGNOSIS — M961 Postlaminectomy syndrome, not elsewhere classified: Secondary | ICD-10-CM | POA: Diagnosis not present

## 2020-09-23 DIAGNOSIS — F1120 Opioid dependence, uncomplicated: Secondary | ICD-10-CM | POA: Diagnosis not present

## 2020-11-21 NOTE — Progress Notes (Unsigned)
Cardiology Office Note   Date:  11/22/2020   ID:  [name], DOB 12/26/1955, MRN [id number]  PCP:  Redmond School, MD  Cardiologist:   Minus Breeding, MD   Chief Complaint  Patient presents with  . Dizziness      History of Present Illness: [name] is a 65 y.o. female who presents for evaluation of hypotension. She has had low blood pressures with orthostatic symptoms.  She did have cortisol checked and this was normal.   Since I last saw her she had Covid in August 2021.  Prior to that she actually been doing very well from an orthostatic hypotension standpoint.  She rarely had symptoms of angina quickly.  However, following Covid she was dizzier and having more orthostatic episodes.  She had been taking 10 mg twice a day of the midodrine with 5 mg in the evening.  She was told to go up to 10 3 times daily which she did.  After feeling better for a while she dropped back down to 10 mg twice a day and 5 mg in the evening and she has felt fine since then.  She has had no presyncope or syncope.  She denies any chest pressure, neck or arm discomfort.  She has had no weight gain or edema.   Past Medical History:  Diagnosis Date  . Chronic back pain   . Complication of anesthesia    shaking and chattery    Past Surgical History:  Procedure Laterality Date  . 8 back surgeries    . COLONOSCOPY  10/14/2011   Procedure: COLONOSCOPY;  Surgeon: Rogene Houston, MD;  Location: [facility];  Service: Endoscopy;  Laterality: N/A;  1030  . left foot surgery    . PARTIAL HYSTERECTOMY       Current Outpatient Medications  Medication Sig Dispense Refill  . amoxicillin-clavulanate (AUGMENTIN) 875-125 MG tablet Take 1 tablet by mouth 2 (two) times daily. 14 tablet 0  . aspirin EC 81 MG tablet Take 81 mg by mouth daily.    . celecoxib (CELEBREX) 200 MG capsule Take 200 mg by mouth 2 (two) times daily.    . cholecalciferol (VITAMIN D) 1000 UNITS tablet Take 1,000 Units by mouth  daily.    Marland Kitchen HYDROcodone-acetaminophen (NORCO) 10-325 MG tablet Take 1 tablet by mouth every 6 (six) hours as needed for moderate pain.    . midodrine (PROAMATINE) 10 MG tablet Take 1 tablet (10 mg total) by mouth 3 (three) times daily with meals. 270 tablet 3  . MINIVELLE 0.1 MG/24HR patch Place 1 patch onto the skin 2 (two) times a week.    . tolterodine (DETROL LA) 4 MG 24 hr capsule Take 4 mg by mouth daily.    . vitamin C (ASCORBIC ACID) 500 MG tablet Take 500 mg by mouth daily.     Current Facility-Administered Medications  Medication Dose Route Frequency Provider Last Rate Last Admin  . triamcinolone acetonide (KENALOG) 10 MG/ML injection 10 mg  10 mg Other Once Wallene Huh, DPM        Allergies:   2,4-d dimethylamine (amisol); Balsam; Methylisothiazolinone; Propolis; and Shellac    ROS:  Please see the history of present illness.   Otherwise, review of systems are positive for none.   All other systems are reviewed and negative.    PHYSICAL EXAM: VS:  BP 104/61   Pulse (!) 53   Ht 5\' 8"  (1.727 m)   Wt 141 lb  12.8 oz (64.3 kg)   SpO2 100%   BMI 21.56 kg/m  , BMI Body mass index is 21.56 kg/m.  GENERAL:  Well appearing NECK:  No jugular venous distention, waveform within normal limits, carotid upstroke brisk and symmetric, no bruits, no thyromegaly LUNGS:  Clear to auscultation bilaterally CHEST:  Unremarkable HEART:  PMI not displaced or sustained,S1 and S2 within normal limits, no S3, no S4, no clicks, no rubs, no murmurs ABD:  Flat, positive bowel sounds normal in frequency in pitch, no bruits, no rebound, no guarding, no midline pulsatile mass, no hepatomegaly, no splenomegaly EXT:  2 plus pulses throughout, no edema, no cyanosis no clubbing   EKG:  EKG is  ordered today. Sinus rhythm, rate 53 axis within normal limits, within normal limits, no acute ST-T wave changes.  Recent Labs: 06/06/2020: ALT 42; BUN 24; Creatinine, Ser 0.71; Hemoglobin 14.5; Platelets 121;  Potassium 3.9; Sodium 138    Lipid Panel No results found for: CHOL, TRIG, HDL, CHOLHDL, VLDL, LDLCALC, LDLDIRECT    Wt Readings from Last 3 Encounters:  11/22/20 141 lb 12.8 oz (64.3 kg)  06/05/20 158 lb (71.7 kg)  02/23/19 199 lb (90.3 kg)      Other studies Reviewed: Additional studies/ records that were reviewed today include: None Review of the above records demonstrates:  NA  ASSESSMENT AND PLAN:  HYPOTENSION:    The patient had transient worsening of her orthostasis but now is back to baseline.  No change in therapy.  I told her that it would be reasonable to have her try to reduce her management in the future to see how symptomatic she is.  We talked about compression garments other than the kneehighs.  She can continue with salt and adequate hydration.   Current medicines are reviewed at length with the patient today.  The patient does not have concerns regarding medicines.  The following changes have been made:  None  Labs/ tests ordered today include:  None    Disposition:   FU with me in the 18 months.     Signed, Minus Breeding, MD  11/22/2020 2:24 PM    [place]

## 2020-11-22 ENCOUNTER — Ambulatory Visit: Payer: Medicare Other | Admitting: Cardiology

## 2020-11-22 ENCOUNTER — Encounter: Payer: Self-pay | Admitting: Cardiology

## 2020-11-22 ENCOUNTER — Other Ambulatory Visit: Payer: Self-pay

## 2020-11-22 VITALS — BP 104/61 | HR 53 | Ht 68.0 in | Wt 141.8 lb

## 2020-11-22 DIAGNOSIS — I950 Idiopathic hypotension: Secondary | ICD-10-CM

## 2020-11-22 NOTE — Patient Instructions (Signed)
Medication Instructions:  No Changes In Medications at this time. PLEASE LET us KNOW IF YOU NEED REFILLS  *If you need a refill on your cardiac medications before your next appointment, please call your pharmacy*  Follow-Up: At [facility], you and your health needs are our priority.  As part of our continuing mission to provide you with exceptional heart care, we have created designated Provider Care Teams.  These Care Teams include your primary Cardiologist (physician) and Advanced Practice Providers (APPs -  Physician Assistants and Nurse Practitioners) who all work together to provide you with the care you need, when you need it.  Your next appointment:   18 month(s)  The format for your next appointment:   In Person  Provider:   Minus Breeding, MD

## 2020-12-17 DIAGNOSIS — M47816 Spondylosis without myelopathy or radiculopathy, lumbar region: Secondary | ICD-10-CM | POA: Diagnosis not present

## 2020-12-17 DIAGNOSIS — F1120 Opioid dependence, uncomplicated: Secondary | ICD-10-CM | POA: Diagnosis not present

## 2020-12-17 DIAGNOSIS — Z79899 Other long term (current) drug therapy: Secondary | ICD-10-CM | POA: Diagnosis not present

## 2020-12-17 DIAGNOSIS — M961 Postlaminectomy syndrome, not elsewhere classified: Secondary | ICD-10-CM | POA: Diagnosis not present

## 2020-12-30 DIAGNOSIS — I9589 Other hypotension: Secondary | ICD-10-CM | POA: Diagnosis not present

## 2020-12-30 DIAGNOSIS — R42 Dizziness and giddiness: Secondary | ICD-10-CM | POA: Diagnosis not present

## 2020-12-30 DIAGNOSIS — E7849 Other hyperlipidemia: Secondary | ICD-10-CM | POA: Diagnosis not present

## 2020-12-30 DIAGNOSIS — E559 Vitamin D deficiency, unspecified: Secondary | ICD-10-CM | POA: Diagnosis not present

## 2020-12-30 DIAGNOSIS — Z6820 Body mass index (BMI) 20.0-20.9, adult: Secondary | ICD-10-CM | POA: Diagnosis not present

## 2020-12-30 DIAGNOSIS — Z1389 Encounter for screening for other disorder: Secondary | ICD-10-CM | POA: Diagnosis not present

## 2021-01-28 DIAGNOSIS — Z6822 Body mass index (BMI) 22.0-22.9, adult: Secondary | ICD-10-CM | POA: Diagnosis not present

## 2021-01-28 DIAGNOSIS — Z1231 Encounter for screening mammogram for malignant neoplasm of breast: Secondary | ICD-10-CM | POA: Diagnosis not present

## 2021-01-28 DIAGNOSIS — Z01419 Encounter for gynecological examination (general) (routine) without abnormal findings: Secondary | ICD-10-CM | POA: Diagnosis not present

## 2021-01-30 DIAGNOSIS — H43313 Vitreous membranes and strands, bilateral: Secondary | ICD-10-CM | POA: Diagnosis not present

## 2021-01-30 DIAGNOSIS — H2513 Age-related nuclear cataract, bilateral: Secondary | ICD-10-CM | POA: Diagnosis not present

## 2021-01-30 DIAGNOSIS — H40003 Preglaucoma, unspecified, bilateral: Secondary | ICD-10-CM | POA: Diagnosis not present

## 2021-01-30 DIAGNOSIS — H814 Vertigo of central origin: Secondary | ICD-10-CM | POA: Diagnosis not present

## 2021-02-01 DIAGNOSIS — G894 Chronic pain syndrome: Secondary | ICD-10-CM | POA: Diagnosis not present

## 2021-02-01 DIAGNOSIS — M5136 Other intervertebral disc degeneration, lumbar region: Secondary | ICD-10-CM | POA: Diagnosis not present

## 2021-02-01 DIAGNOSIS — F4542 Pain disorder with related psychological factors: Secondary | ICD-10-CM | POA: Diagnosis not present

## 2021-02-01 DIAGNOSIS — I9589 Other hypotension: Secondary | ICD-10-CM | POA: Diagnosis not present

## 2021-02-03 DIAGNOSIS — R42 Dizziness and giddiness: Secondary | ICD-10-CM | POA: Diagnosis not present

## 2021-02-06 DIAGNOSIS — E039 Hypothyroidism, unspecified: Secondary | ICD-10-CM | POA: Diagnosis not present

## 2021-02-06 DIAGNOSIS — Z6820 Body mass index (BMI) 20.0-20.9, adult: Secondary | ICD-10-CM | POA: Diagnosis not present

## 2021-02-06 DIAGNOSIS — E063 Autoimmune thyroiditis: Secondary | ICD-10-CM | POA: Diagnosis not present

## 2021-02-25 DIAGNOSIS — H8113 Benign paroxysmal vertigo, bilateral: Secondary | ICD-10-CM | POA: Diagnosis not present

## 2021-03-04 DIAGNOSIS — E7849 Other hyperlipidemia: Secondary | ICD-10-CM | POA: Diagnosis not present

## 2021-03-04 DIAGNOSIS — I9589 Other hypotension: Secondary | ICD-10-CM | POA: Diagnosis not present

## 2021-03-04 DIAGNOSIS — M5136 Other intervertebral disc degeneration, lumbar region: Secondary | ICD-10-CM | POA: Diagnosis not present

## 2021-03-04 DIAGNOSIS — G894 Chronic pain syndrome: Secondary | ICD-10-CM | POA: Diagnosis not present

## 2021-04-15 ENCOUNTER — Ambulatory Visit: Payer: Medicare Other | Admitting: Neurology

## 2021-04-25 ENCOUNTER — Ambulatory Visit: Payer: Medicare Other | Admitting: Neurology

## 2021-05-04 DIAGNOSIS — I9589 Other hypotension: Secondary | ICD-10-CM | POA: Diagnosis not present

## 2021-05-04 DIAGNOSIS — M5136 Other intervertebral disc degeneration, lumbar region: Secondary | ICD-10-CM | POA: Diagnosis not present

## 2021-05-04 DIAGNOSIS — G894 Chronic pain syndrome: Secondary | ICD-10-CM | POA: Diagnosis not present

## 2021-05-04 DIAGNOSIS — E7849 Other hyperlipidemia: Secondary | ICD-10-CM | POA: Diagnosis not present

## 2021-05-20 DIAGNOSIS — H5213 Myopia, bilateral: Secondary | ICD-10-CM | POA: Diagnosis not present

## 2021-06-12 DIAGNOSIS — M961 Postlaminectomy syndrome, not elsewhere classified: Secondary | ICD-10-CM | POA: Diagnosis not present

## 2021-06-12 DIAGNOSIS — Z79899 Other long term (current) drug therapy: Secondary | ICD-10-CM | POA: Diagnosis not present

## 2021-06-12 DIAGNOSIS — F1120 Opioid dependence, uncomplicated: Secondary | ICD-10-CM | POA: Diagnosis not present

## 2021-06-12 DIAGNOSIS — M47816 Spondylosis without myelopathy or radiculopathy, lumbar region: Secondary | ICD-10-CM | POA: Diagnosis not present

## 2021-07-04 DIAGNOSIS — E782 Mixed hyperlipidemia: Secondary | ICD-10-CM | POA: Diagnosis not present

## 2021-07-04 DIAGNOSIS — G894 Chronic pain syndrome: Secondary | ICD-10-CM | POA: Diagnosis not present

## 2021-07-04 DIAGNOSIS — I9589 Other hypotension: Secondary | ICD-10-CM | POA: Diagnosis not present

## 2021-07-04 DIAGNOSIS — M5136 Other intervertebral disc degeneration, lumbar region: Secondary | ICD-10-CM | POA: Diagnosis not present

## 2021-09-19 DIAGNOSIS — N3942 Incontinence without sensory awareness: Secondary | ICD-10-CM | POA: Diagnosis not present

## 2021-09-19 DIAGNOSIS — N393 Stress incontinence (female) (male): Secondary | ICD-10-CM | POA: Diagnosis not present

## 2021-09-30 ENCOUNTER — Encounter (INDEPENDENT_AMBULATORY_CARE_PROVIDER_SITE_OTHER): Payer: Self-pay | Admitting: *Deleted

## 2021-10-06 ENCOUNTER — Other Ambulatory Visit: Payer: Self-pay | Admitting: Cardiology

## 2021-10-16 DIAGNOSIS — Z23 Encounter for immunization: Secondary | ICD-10-CM | POA: Diagnosis not present

## 2021-10-21 DIAGNOSIS — H2511 Age-related nuclear cataract, right eye: Secondary | ICD-10-CM | POA: Diagnosis not present

## 2021-10-21 DIAGNOSIS — H00021 Hordeolum internum right upper eyelid: Secondary | ICD-10-CM | POA: Diagnosis not present

## 2021-10-28 ENCOUNTER — Telehealth (INDEPENDENT_AMBULATORY_CARE_PROVIDER_SITE_OTHER): Payer: Self-pay

## 2021-10-28 ENCOUNTER — Telehealth (INDEPENDENT_AMBULATORY_CARE_PROVIDER_SITE_OTHER): Payer: Self-pay | Admitting: *Deleted

## 2021-10-28 ENCOUNTER — Encounter (INDEPENDENT_AMBULATORY_CARE_PROVIDER_SITE_OTHER): Payer: Self-pay | Admitting: *Deleted

## 2021-10-28 MED ORDER — PEG 3350-KCL-NA BICARB-NACL 420 G PO SOLR: 4000.0000 mL | ORAL | 0 refills | Status: DC

## 2021-10-28 NOTE — Telephone Encounter (Signed)
[name] Ann Jeanett Antonopoulos, CMA  ?

## 2021-10-28 NOTE — Telephone Encounter (Signed)
Referring MD/PCP: fusco  Procedure: tcs  Reason/Indication:  screening  Has patient had this procedure before?  Yes, 2013  If so, when, by whom and where?    Is there a family history of colon cancer?  no  Who?  What age when diagnosed?    Is patient diabetic? If yes, Type 1 or Type 2   no      Does patient have prosthetic heart valve or mechanical valve?  no  Do you have a pacemaker/defibrillator?  no  Has patient ever had endocarditis/atrial fibrillation? no  Does patient use oxygen? no  Has patient had joint replacement within last 12 months?  no  Is patient constipated or do they take laxatives? no  Does patient have a history of alcohol/drug use?  no  Have you had a stroke/heart attack last 6 mths? no  Do you take medicine for weight loss?  no  For female patients,: have you had a hysterectomy yes, 1987                      are you post menopausal                       do you still have your menstrual cycle   Is patient on blood thinner such as Coumadin, Plavix and/or Aspirin? yes  Medications: asa 81 mg daily, celecoxib 20 mg bid, tolterodine 4 mg daily, estradiol 2 mg daily, midodrine 5 mg 2 tab in, 2 tab at lunch & 1 tab in evening, hydrocodone 10/325 mg prn, lutein eyes 18 mg daily, turmeric 1000 mg bid, xyzal 5 mg daily, VIt D3 250 mcg daily, fish oil 1000 mg daily, zinc 50 mg daily, Vit C daily  Allergies: nkda  Medication Adjustment per Dr Rehman/Dr Jenetta Downer asa 2 days  Procedure date & time: 11/19/21

## 2021-10-28 NOTE — Telephone Encounter (Signed)
Patient needs trilyte 

## 2021-10-30 ENCOUNTER — Other Ambulatory Visit (INDEPENDENT_AMBULATORY_CARE_PROVIDER_SITE_OTHER): Payer: Self-pay

## 2021-10-30 DIAGNOSIS — Z1211 Encounter for screening for malignant neoplasm of colon: Secondary | ICD-10-CM

## 2021-11-12 NOTE — Patient Instructions (Signed)
°   Your procedure is scheduled on: 11/19/2021  Report to [facility] Entrance at    8:30 AM.  Call this number if you have problems the morning of surgery: [phone]   Remember:              Follow Directions on the letter you received from Your Physician's office regarding the Bowel Prep              No Smoking the day of Procedure :   Take these medicines the morning of surgery with A SIP OF WATER: Celebrex, hydrocodone, xyzal, detrol, and midodrine   Do not wear jewelry, make-up or nail polish.    Do not bring valuables to the hospital.  Contacts, dentures or bridgework may not be worn into surgery.  .   Patients discharged the day of surgery will not be allowed to drive home.     Colonoscopy, Adult, Care After This sheet gives you information about how to care for yourself after your procedure. Your health care provider may also give you more specific instructions. If you have problems or questions, contact your health care provider. What can I expect after the procedure? After the procedure, it is common to have: A small amount of blood in your stool for 24 hours after the procedure. Some gas. Mild abdominal cramping or bloating.  Follow these instructions at home: General instructions  For the first 24 hours after the procedure: Do not drive or use machinery. Do not sign important documents. Do not drink alcohol. Do your regular daily activities at a slower pace than normal. Eat soft, easy-to-digest foods. Rest often. Take over-the-counter or prescription medicines only as told by your health care provider. It is up to you to get the results of your procedure. Ask your health care provider, or the department performing the procedure, when your results will be ready. Relieving cramping and bloating Try walking around when you have cramps or feel bloated. Apply heat to your abdomen as told by your health care provider. Use a heat source that your health care  provider recommends, such as a moist heat pack or a heating pad. Place a towel between your skin and the heat source. Leave the heat on for 20-30 minutes. Remove the heat if your skin turns bright red. This is especially important if you are unable to feel pain, heat, or cold. You may have a greater risk of getting burned. Eating and drinking Drink enough fluid to keep your urine clear or pale yellow. Resume your normal diet as instructed by your health care provider. Avoid heavy or fried foods that are hard to digest. Avoid drinking alcohol for as long as instructed by your health care provider. Contact a health care provider if: You have blood in your stool 2-3 days after the procedure. Get help right away if: You have more than a small spotting of blood in your stool. You pass large blood clots in your stool. Your abdomen is swollen. You have nausea or vomiting. You have a fever. You have increasing abdominal pain that is not relieved with medicine. This information is not intended to replace advice given to you by your health care provider. Make sure you discuss any questions you have with your health care provider. Document Released: 05/05/2004 Document Revised: 06/15/2016 Document Reviewed: 12/03/2015 Elsevier Interactive Patient Education  [employer].

## 2021-11-14 ENCOUNTER — Encounter (HOSPITAL_COMMUNITY)
Admission: RE | Admit: 2021-11-14 | Discharge: 2021-11-14 | Disposition: A | Discharge status: Home / Self Care | Payer: PPO | Source: Ambulatory Visit | Attending: Internal Medicine | Admitting: Internal Medicine

## 2021-11-19 ENCOUNTER — Ambulatory Visit (HOSPITAL_COMMUNITY): Payer: PPO | Admitting: Anesthesiology

## 2021-11-19 ENCOUNTER — Ambulatory Visit (HOSPITAL_COMMUNITY)
Admission: RE | Admit: 2021-11-19 | Discharge: 2021-11-19 | Disposition: A | Discharge status: Home / Self Care | Payer: PPO | Source: Ambulatory Visit | Attending: Internal Medicine | Admitting: Internal Medicine

## 2021-11-19 ENCOUNTER — Other Ambulatory Visit: Payer: Self-pay

## 2021-11-19 ENCOUNTER — Encounter (HOSPITAL_COMMUNITY)
Admission: RE | Disposition: A | Discharge status: Home / Self Care | Payer: Self-pay | Source: Ambulatory Visit | Attending: Internal Medicine

## 2021-11-19 ENCOUNTER — Encounter (HOSPITAL_COMMUNITY): Payer: Self-pay | Admitting: Internal Medicine

## 2021-11-19 ENCOUNTER — Ambulatory Visit (HOSPITAL_BASED_OUTPATIENT_CLINIC_OR_DEPARTMENT_OTHER): Payer: PPO | Admitting: Anesthesiology

## 2021-11-19 DIAGNOSIS — Z79899 Other long term (current) drug therapy: Secondary | ICD-10-CM | POA: Diagnosis not present

## 2021-11-19 DIAGNOSIS — Z1211 Encounter for screening for malignant neoplasm of colon: Secondary | ICD-10-CM

## 2021-11-19 DIAGNOSIS — Z791 Long term (current) use of non-steroidal anti-inflammatories (NSAID): Secondary | ICD-10-CM | POA: Diagnosis not present

## 2021-11-19 DIAGNOSIS — K5730 Diverticulosis of large intestine without perforation or abscess without bleeding: Secondary | ICD-10-CM | POA: Insufficient documentation

## 2021-11-19 DIAGNOSIS — K644 Residual hemorrhoidal skin tags: Secondary | ICD-10-CM

## 2021-11-19 HISTORY — PX: COLONOSCOPY WITH PROPOFOL: SHX5780

## 2021-11-19 SURGERY — COLONOSCOPY WITH PROPOFOL
Anesthesia: General

## 2021-11-19 MED ORDER — PROPOFOL 500 MG/50ML IV EMUL
INTRAVENOUS | Status: DC | PRN
  Administered 2021-11-19: 160 ug/kg/min via INTRAVENOUS

## 2021-11-19 MED ORDER — PROPOFOL 10 MG/ML IV BOLUS
INTRAVENOUS | Status: DC | PRN
Start: 2021-11-19 — End: 2021-11-19
  Administered 2021-11-19: 60 mg via INTRAVENOUS

## 2021-11-19 MED ORDER — LACTATED RINGERS IV SOLN: INTRAVENOUS | Status: DC

## 2021-11-19 MED ORDER — PROPOFOL 500 MG/50ML IV EMUL
INTRAVENOUS | Status: AC
  Filled 2021-11-19: qty 50

## 2021-11-19 NOTE — Discharge Instructions (Signed)
Resume usual medications including aspirin as before. Resume usual diet. No driving for 24 hours. Next screening exam in 10 years.

## 2021-11-19 NOTE — H&P (Signed)
[name] is an 66 y.o. female.   Chief Complaint: Patient is here for colonoscopy.   HPI: Patient is 66 year old Caucasian female who is here for screening colonoscopy.  Last exam was in January 2013 revealing a small polyp but was not an adenoma.  Patient denies change in bowel habits abdominal pain or rectal bleeding. Family history is negative for colorectal carcinoma. Last aspirin dose was 1 week ago.   Past Medical History:  Diagnosis Date   Chronic back pain    Complication of anesthesia    shaking and chattery    Past Surgical History:  Procedure Laterality Date   8 back surgeries     COLONOSCOPY  10/14/2011   Procedure: COLONOSCOPY;  Surgeon: Rogene Houston, MD;  [facility];  Service: Endoscopy;  Laterality: N/A;  1030   left foot surgery     PARTIAL HYSTERECTOMY      Family History  Problem Relation Age of Onset   Heart attack Father 5       CABG   CAD Father    Prostate cancer Father    Heart disease Paternal Grandfather    Social History:  reports that she has never smoked. She has never used smokeless tobacco. She reports that she does not drink alcohol and does not use drugs.  Allergies:  Allergies  Allergen Reactions   2,4-D Dimethylamine Rash    Positive in patch testing Fragrance mix 1 and cinnamic aldehyde   Balsam Rash    Positive in patch testing     Methylisothiazolinone Rash    Positive in patch testing     Propolis Rash    Positive in patch testing     Shellac Rash    Positive in patch testing      Facility-Administered Medications Prior to Admission  Medication Dose Route Frequency Provider Last Rate Last Admin   triamcinolone acetonide (KENALOG) 10 MG/ML injection 10 mg  10 mg Other Once Regal, Norman S, DPM       Medications Prior to Admission  Medication Sig Dispense Refill   Ascorbic Acid (VITAMIN C) 1000 MG tablet Take 1,000 mg by mouth daily.     aspirin EC 81 MG tablet Take 81 mg by mouth at bedtime.      celecoxib (CELEBREX) 200 MG capsule Take 200 mg by mouth 2 (two) times daily.     Cholecalciferol (VITAMIN D3) 250 MCG (10000 UT) capsule Take 10,000 Units by mouth daily.     estradiol (ESTRACE) 2 MG tablet Take 2 mg by mouth daily.     Homeopathic Products (THERAWORX RELIEF EX) Apply 1 spray topically daily as needed (pain).     HYDROcodone-acetaminophen (NORCO) 10-325 MG tablet Take 1 tablet by mouth every 4 (four) hours as needed for moderate pain.     levocetirizine (XYZAL) 5 MG tablet Take 5 mg by mouth daily as needed for allergies.     LUTEIN PO Take 18 mg by mouth daily.     midodrine (PROAMATINE) 5 MG tablet TAKE 2 TABLETS IN THE MORNING, 2 TABLETS AT LUNCH, AND 2 TABLET IN THE EVENING (5 HOURS PRIOR TO BEDTIME). (Patient taking differently: Take 5-10 mg by mouth See admin instructions. Take 10 mg in the morning, 10 mg at lunch, and 5 mg in the evening) 180 tablet 3   Omega-3 Fatty Acids (FISH OIL) 1000 MG CAPS Take 1,000 mg by mouth daily.     OVER THE COUNTER MEDICATION Take 4 capsules by mouth  daily. 8 greens otc supplement     polyethylene glycol-electrolytes (TRILYTE) 420 g solution Take 4,000 mLs by mouth as directed. 4000 mL 0   tolterodine (DETROL LA) 4 MG 24 hr capsule Take 4 mg by mouth daily.     TURMERIC PO Take 1,000 mg by mouth in the morning and at bedtime.     zinc gluconate 50 MG tablet Take 100 mg by mouth daily.      No results found for this or any previous visit (from the past 48 hour(s)). No results found.  Review of Systems  Blood pressure 110/72, pulse 86, temperature 98.2 F (36.8 C), temperature source Oral, resp. rate 10, height 5\' 8"  (1.727 m), weight 64.3 kg, SpO2 100 %. Physical Exam HENT:     Mouth/Throat:     Mouth: Mucous membranes are moist.     Pharynx: Oropharynx is clear.  Eyes:     Conjunctiva/sclera: Conjunctivae normal.  Cardiovascular:     Rate and Rhythm: Normal rate and regular rhythm.     Heart sounds: Normal heart sounds. No  murmur heard. Pulmonary:     Effort: Pulmonary effort is normal.     Breath sounds: Normal breath sounds.  Abdominal:     General: There is no distension.     Palpations: Abdomen is soft. There is no mass.     Tenderness: There is no abdominal tenderness.  Musculoskeletal:        General: No swelling.     Cervical back: Neck supple.  Lymphadenopathy:     Cervical: No cervical adenopathy.  Skin:    General: Skin is warm and dry.  Neurological:     Mental Status: She is alert.     Assessment/Plan  Average risk screening colonoscopy  Hildred Laser, MD 11/19/2021, 10:34 AM

## 2021-11-19 NOTE — Transfer of Care (Signed)
Immediate Anesthesia Transfer of Care Note  Patient: VAYLA WILHELMI  Procedure(s) Performed: COLONOSCOPY WITH PROPOFOL  Patient Location: PACU  Anesthesia Type:General  Level of Consciousness: awake, alert  and oriented  Airway & Oxygen Therapy: Patient Spontanous Breathing and Patient connected to nasal cannula oxygen  Post-op Assessment: Report given to RN, Post -op Vital signs reviewed and stable, Patient moving all extremities X 4 and Patient able to stick tongue midline  Post vital signs: Reviewed  Last Vitals:  Vitals Value Taken Time  BP 84/50   Temp 98.6   Pulse 63   Resp 15   SpO2 100     Last Pain:  Vitals:   11/19/21 1037  TempSrc:   PainSc: 0-No pain         Complications: No notable events documented.

## 2021-11-19 NOTE — Op Note (Signed)
[facility] Patient Name: [name] Procedure Date: 11/19/2021 10:14 AM MRN: [id number] Date of Birth: December 14, 1955 Attending MD: Hildred Laser , MD CSN: [id number] Age: 66 Admit Type: Outpatient Procedure:                Colonoscopy Indications:              Screening for colorectal malignant neoplasm Providers:                Hildred Laser, MD, [place] Page, [place] Risa Grill, Technician Referring MD:             Redmond School, MD Medicines:                Propofol per Anesthesia Complications:            No immediate complications. Estimated Blood Loss:     Estimated blood loss: none. Procedure:                Pre-Anesthesia Assessment:                           - Prior to the procedure, a History and Physical                            was performed, and patient medications and                            allergies were reviewed. The patient's tolerance of                            previous anesthesia was also reviewed. The risks                            and benefits of the procedure and the sedation                            options and risks were discussed with the patient.                            All questions were answered, and informed consent                            was obtained. Prior Anticoagulants: The patient has                            taken no previous anticoagulant or antiplatelet                            agents except for aspirin and has taken no previous                            anticoagulant or antiplatelet agents except for  NSAID medication. ASA Grade Assessment: II - A                            patient with mild systemic disease. After reviewing                            the risks and benefits, the patient was deemed in                            satisfactory condition to undergo the procedure.                           After obtaining informed consent, the colonoscope                             was passed under direct vision. Throughout the                            procedure, the patient's blood pressure, pulse, and                            oxygen saturations were monitored continuously. The                            [id number]-HQ190L ([id number]) scope was introduced through                            the anus and advanced to the the cecum, identified                            by appendiceal orifice and ileocecal valve. The                            colonoscopy was performed without difficulty. The                            patient tolerated the procedure well. The quality                            of the bowel preparation was good. The ileocecal                            valve, appendiceal orifice, and rectum were                            photographed. Scope In: 10:40:49 AM Scope Out: 10:59:24 AM Scope Withdrawal Time: 0 hours 10 minutes 42 seconds  Total Procedure Duration: 0 hours 18 minutes 35 seconds  Findings:      The perianal and digital rectal examinations were normal.      Two diverticula were found in the hepatic flexure and ascending colon.      The exam was otherwise normal throughout the examined colon.      External hemorrhoids were found during retroflexion. The hemorrhoids  were small. Impression:               - Diverticulosis at the hepatic flexure and in the                            ascending colon.                           - External hemorrhoids.                           - No specimens collected. Moderate Sedation:      Per Anesthesia Care Recommendation:           - Patient has a contact number available for                            emergencies. The signs and symptoms of potential                            delayed complications were discussed with the                            patient. Return to normal activities tomorrow.                            Written discharge instructions were provided to the                             patient.                           - Resume previous diet today.                           - Continue present medications.                           - Repeat colonoscopy in 10 years for screening                            purposes. Procedure Code(s):        --- Professional ---                           [phone], Colonoscopy, flexible; diagnostic, including                            collection of specimen(s) by brushing or washing,                            when performed (separate procedure) Diagnosis Code(s):        --- Professional ---                           Z12.11, Encounter for screening for malignant  neoplasm of colon                           K64.4, Residual hemorrhoidal skin tags                           K57.30, Diverticulosis of large intestine without                            perforation or abscess without bleeding CPT copyright 2019 [employer]. All rights reserved. The codes documented in this report are preliminary and upon coder review may  be revised to meet current compliance requirements. Hildred Laser, MD Hildred Laser, MD 11/19/2021 11:07:38 AM This report has been signed electronically. Number of Addenda: 0

## 2021-11-19 NOTE — Anesthesia Preprocedure Evaluation (Signed)

## 2021-11-19 NOTE — Anesthesia Postprocedure Evaluation (Signed)
Anesthesia Post Note  Patient: [name]  Procedure(s) Performed: COLONOSCOPY WITH PROPOFOL  Patient location during evaluation: Phase II Anesthesia Type: General Level of consciousness: awake Pain management: pain level controlled Vital Signs Assessment: post-procedure vital signs reviewed and stable Respiratory status: spontaneous breathing and respiratory function stable Cardiovascular status: blood pressure returned to baseline and stable Postop Assessment: no headache and no apparent nausea or vomiting Anesthetic complications: no Comments: Late entry   No notable events documented.   Last Vitals:  Vitals:   11/19/21 1104 11/19/21 1107  BP:  (!) 99/53  Pulse: 63   Resp: 17   Temp: 36.4 C   SpO2: 100%     Last Pain:  Vitals:   11/19/21 1104  TempSrc: Axillary  PainSc: 0-No pain                 Louann Sjogren

## 2021-11-21 ENCOUNTER — Encounter (HOSPITAL_COMMUNITY): Payer: Self-pay | Admitting: Internal Medicine

## 2021-11-27 DIAGNOSIS — Z83511 Family history of glaucoma: Secondary | ICD-10-CM | POA: Diagnosis not present

## 2021-11-27 DIAGNOSIS — H40003 Preglaucoma, unspecified, bilateral: Secondary | ICD-10-CM | POA: Diagnosis not present

## 2021-11-27 DIAGNOSIS — H2513 Age-related nuclear cataract, bilateral: Secondary | ICD-10-CM | POA: Diagnosis not present

## 2021-12-08 DIAGNOSIS — M47816 Spondylosis without myelopathy or radiculopathy, lumbar region: Secondary | ICD-10-CM | POA: Diagnosis not present

## 2021-12-08 DIAGNOSIS — Z79899 Other long term (current) drug therapy: Secondary | ICD-10-CM | POA: Diagnosis not present

## 2021-12-08 DIAGNOSIS — M961 Postlaminectomy syndrome, not elsewhere classified: Secondary | ICD-10-CM | POA: Diagnosis not present

## 2021-12-08 DIAGNOSIS — F1120 Opioid dependence, uncomplicated: Secondary | ICD-10-CM | POA: Diagnosis not present

## 2022-02-18 DIAGNOSIS — E559 Vitamin D deficiency, unspecified: Secondary | ICD-10-CM | POA: Diagnosis not present

## 2022-02-18 DIAGNOSIS — M5136 Other intervertebral disc degeneration, lumbar region: Secondary | ICD-10-CM | POA: Diagnosis not present

## 2022-02-18 DIAGNOSIS — Z1331 Encounter for screening for depression: Secondary | ICD-10-CM | POA: Diagnosis not present

## 2022-02-18 DIAGNOSIS — E063 Autoimmune thyroiditis: Secondary | ICD-10-CM | POA: Diagnosis not present

## 2022-02-18 DIAGNOSIS — Z6827 Body mass index (BMI) 27.0-27.9, adult: Secondary | ICD-10-CM | POA: Diagnosis not present

## 2022-02-18 DIAGNOSIS — E663 Overweight: Secondary | ICD-10-CM | POA: Diagnosis not present

## 2022-02-18 DIAGNOSIS — Z0001 Encounter for general adult medical examination with abnormal findings: Secondary | ICD-10-CM | POA: Diagnosis not present

## 2022-02-24 DIAGNOSIS — H2513 Age-related nuclear cataract, bilateral: Secondary | ICD-10-CM | POA: Diagnosis not present

## 2022-02-24 DIAGNOSIS — H40003 Preglaucoma, unspecified, bilateral: Secondary | ICD-10-CM | POA: Diagnosis not present

## 2022-03-16 DIAGNOSIS — N959 Unspecified menopausal and perimenopausal disorder: Secondary | ICD-10-CM | POA: Diagnosis not present

## 2022-03-16 DIAGNOSIS — Z1231 Encounter for screening mammogram for malignant neoplasm of breast: Secondary | ICD-10-CM | POA: Diagnosis not present

## 2022-03-16 DIAGNOSIS — N952 Postmenopausal atrophic vaginitis: Secondary | ICD-10-CM | POA: Diagnosis not present

## 2022-03-16 DIAGNOSIS — Z6829 Body mass index (BMI) 29.0-29.9, adult: Secondary | ICD-10-CM | POA: Diagnosis not present

## 2022-03-16 DIAGNOSIS — Z01419 Encounter for gynecological examination (general) (routine) without abnormal findings: Secondary | ICD-10-CM | POA: Diagnosis not present

## 2022-03-26 DIAGNOSIS — F1120 Opioid dependence, uncomplicated: Secondary | ICD-10-CM | POA: Diagnosis not present

## 2022-03-26 DIAGNOSIS — Z6826 Body mass index (BMI) 26.0-26.9, adult: Secondary | ICD-10-CM | POA: Diagnosis not present

## 2022-03-26 DIAGNOSIS — M961 Postlaminectomy syndrome, not elsewhere classified: Secondary | ICD-10-CM | POA: Diagnosis not present

## 2022-05-19 IMAGING — DX DG CHEST 1V PORT
1 series · 1 of 1 positions shown · non-contrast
Comparison: 02/14/2009

CLINICAL DATA: W10ZO-5S positive, increasing shortness of breath,
weakness

EXAM:
PORTABLE CHEST 1 VIEW

[chest ap]
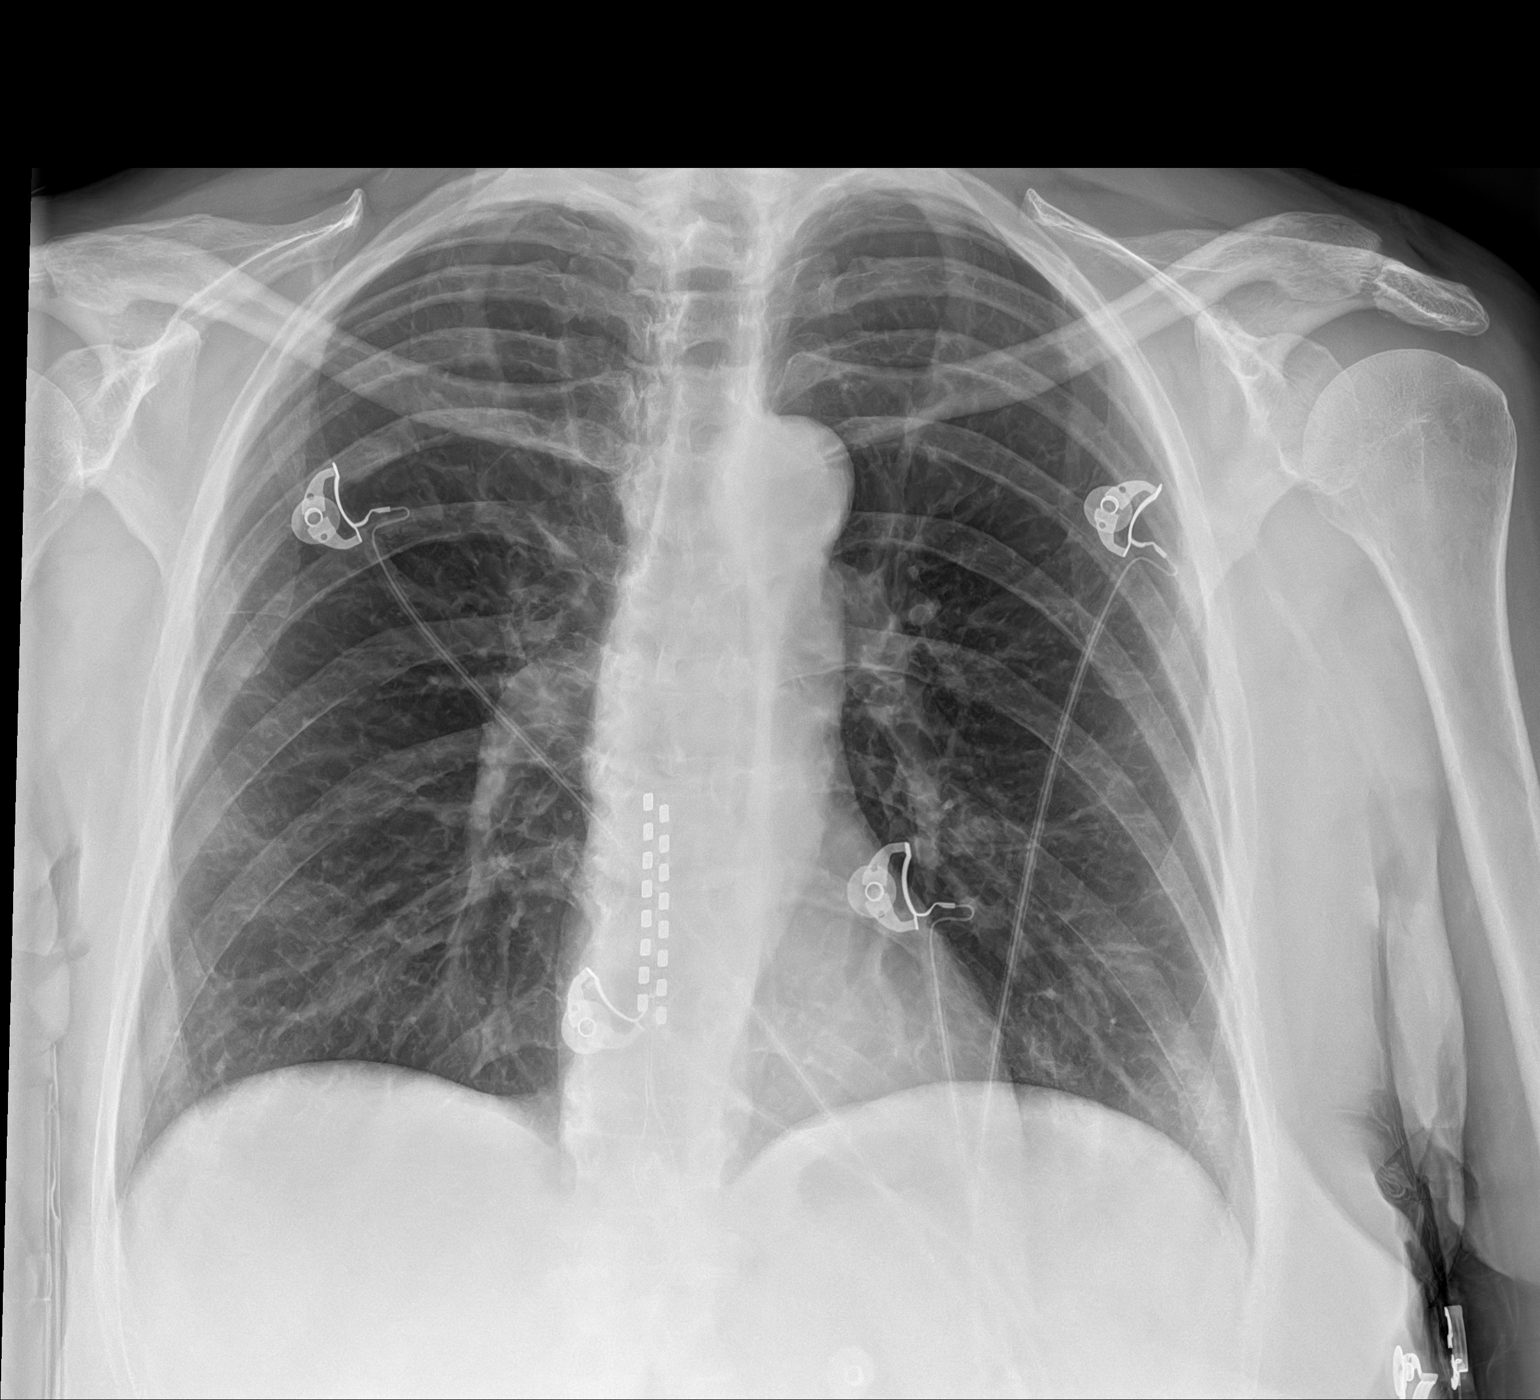

[1 of 1 positions shown; findings below may reference images not displayed]

FINDINGS: Single frontal view of the chest demonstrates an unremarkable
cardiac silhouette. Spinal stimulator overlies the lower thoracic
central canal. No airspace disease, effusion, or pneumothorax. No
acute bony abnormalities.
IMPRESSION: 1. No acute intrathoracic process.

## 2022-06-03 DIAGNOSIS — I951 Orthostatic hypotension: Secondary | ICD-10-CM | POA: Insufficient documentation

## 2022-06-03 NOTE — Progress Notes (Unsigned)
Cardiology Office Note   Date:  06/04/2022   ID:  [name], [name] Dec 30, 1955, MRN [id number]  PCP:  Redmond School, MD  Cardiologist:   Minus Breeding, MD   Chief Complaint  Patient presents with   Dizziness      History of Present Illness: [name] is a 66 y.o. female who presents for evaluation of hypotension. She has had low blood pressures with orthostatic symptoms.  She did have cortisol checked and this was normal.   She had Covid in August 2021.  I last saw her in Feb 2022.   She said that she tried to take a lesser dose of midodrine since I last saw her.  She started having more hypotension so she is gone back up on the dose although she is taking 5 mg 3 times a day instead of 10.  She denies any new presyncope or syncope and she feels better when she takes a higher dose.  She is not having any chest pressure, neck or arm discomfort.  She has had no weight gain or edema and she walks 3 miles 5 days a week.   Past Medical History:  Diagnosis Date   Chronic back pain    Complication of anesthesia    shaking and chattery    Past Surgical History:  Procedure Laterality Date   8 back surgeries     COLONOSCOPY  10/14/2011   Procedure: COLONOSCOPY;  Surgeon: Rogene Houston, MD;  [facility]: [facility];  Service: Endoscopy;  Laterality: N/A;  1030   COLONOSCOPY WITH PROPOFOL N/A 11/19/2021   Procedure: COLONOSCOPY WITH PROPOFOL;  Surgeon: Rogene Houston, MD;  [facility]: [facility];  Service: Endoscopy;  Laterality: N/A;  10:00   left foot surgery     PARTIAL HYSTERECTOMY       Current Outpatient Medications  Medication Sig Dispense Refill   Ascorbic Acid (VITAMIN C) 1000 MG tablet Take 1,000 mg by mouth daily.     aspirin EC 81 MG tablet Take 81 mg by mouth at bedtime.     celecoxib (CELEBREX) 200 MG capsule Take 200 mg by mouth 2 (two) times daily.     Cholecalciferol (VITAMIN D3) 250 MCG (10000 UT) capsule Take 10,000 Units by mouth daily.      estradiol (ESTRACE) 2 MG tablet Take 2 mg by mouth daily.     Homeopathic Products (THERAWORX RELIEF EX) Apply 1 spray topically daily as needed (pain).     HYDROcodone-acetaminophen (NORCO) 10-325 MG tablet Take 1 tablet by mouth every 4 (four) hours as needed for moderate pain.     levocetirizine (XYZAL) 5 MG tablet Take 5 mg by mouth daily as needed for allergies.     LUTEIN PO Take 18 mg by mouth daily.     midodrine (PROAMATINE) 5 MG tablet TAKE 2 TABLETS IN THE MORNING, 2 TABLETS AT LUNCH, AND 2 TABLET IN THE EVENING (5 HOURS PRIOR TO BEDTIME). (Patient taking differently: Take 5-10 mg by mouth See admin instructions. Take 10 mg in the morning, 10 mg at lunch, and 5 mg in the evening) 180 tablet 3   Omega-3 Fatty Acids (FISH OIL) 1000 MG CAPS Take 1,000 mg by mouth daily.     OVER THE COUNTER MEDICATION Take 4 capsules by mouth daily. 8 greens otc supplement     tolterodine (DETROL LA) 4 MG 24 hr capsule Take 4 mg by mouth daily.     TURMERIC PO Take  1,000 mg by mouth in the morning and at bedtime.     zinc gluconate 50 MG tablet Take 100 mg by mouth daily.     Current Facility-Administered Medications  Medication Dose Route Frequency Provider Last Rate Last Admin   triamcinolone acetonide (KENALOG) 10 MG/ML injection 10 mg  10 mg Other Once Wallene Huh, DPM        Allergies:   2,4-d dimethylamine; Balsam; Methylisothiazolinone; Propolis; and Shellac    ROS:  Please see the history of present illness.   Otherwise, review of systems are positive for none.   All other systems are reviewed and negative.    PHYSICAL EXAM: VS:  BP 115/76   Pulse (!) 50   Ht '5\' 8"'$  (1.727 m)   Wt 191 lb (86.6 kg)   SpO2 99%   BMI 29.04 kg/m  , BMI Body mass index is 29.04 kg/m.  GENERAL:  Well appearing NECK:  No jugular venous distention, waveform within normal limits, carotid upstroke brisk and symmetric, no bruits, no thyromegaly LUNGS:  Clear to auscultation bilaterally CHEST:   Unremarkable HEART:  PMI not displaced or sustained,S1 and S2 within normal limits, no S3, no S4, no clicks, no rubs, no murmurs ABD:  Flat, positive bowel sounds normal in frequency in pitch, no bruits, no rebound, no guarding, no midline pulsatile mass, no hepatomegaly, no splenomegaly EXT:  2 plus pulses throughout, no edema, no cyanosis no clubbing    EKG:  EKG is  ordered today. Sinus rhythm, rate 50 axis within normal limits, within normal limits, no acute ST-T wave changes.  Recent Labs: No results found for requested labs within last 365 days.    Lipid Panel No results found for: "CHOL", "TRIG", "HDL", "CHOLHDL", "VLDL", "LDLCALC", "LDLDIRECT"    Wt Readings from Last 3 Encounters:  06/04/22 191 lb (86.6 kg)  11/19/21 141 lb 12.1 oz (64.3 kg)  11/22/20 141 lb 12.8 oz (64.3 kg)      Other studies Reviewed: Additional studies/ records that were reviewed today include: None Review of the above records demonstrates:  NA  ASSESSMENT AND PLAN:  HYPOTENSION:   She is going to continue with 3 times daily midodrine.  I do not think any further change in therapy is indicated.  Of note she does have bradycardia but she is going to keep an eye on this.  When she wore a monitor before she had normal chronotropic competence she will watch to make sure she has appropriate heart rate response with activity.   Current medicines are reviewed at length with the patient today.  The patient does not have concerns regarding medicines.  The following changes have been made:   None  Labs/ tests ordered today include:  None    Disposition:   FU with me in the 12 months.     Signed, Minus Breeding, MD  06/04/2022 9:19 AM    [facility]

## 2022-06-04 ENCOUNTER — Encounter: Payer: Self-pay | Admitting: Cardiology

## 2022-06-04 ENCOUNTER — Ambulatory Visit: Payer: PPO | Attending: Cardiology | Admitting: Cardiology

## 2022-06-04 VITALS — BP 115/76 | HR 50 | Ht 68.0 in | Wt 191.0 lb

## 2022-06-04 DIAGNOSIS — I951 Orthostatic hypotension: Secondary | ICD-10-CM | POA: Diagnosis not present

## 2022-06-04 NOTE — Patient Instructions (Signed)
   Follow-Up: At [facility], you and your health needs are our priority.  As part of our continuing mission to provide you with exceptional heart care, we have created designated Provider Care Teams.  These Care Teams include your primary Cardiologist (physician) and Advanced Practice Providers (APPs -  Physician Assistants and Nurse Practitioners) who all work together to provide you with the care you need, when you need it.  We recommend signing up for the patient portal called "MyChart".  Sign up information is provided on this After Visit Summary.  MyChart is used to connect with patients for Virtual Visits (Telemedicine).  Patients are able to view lab/test results, encounter notes, upcoming appointments, etc.  Non-urgent messages can be sent to your provider as well.   To learn more about what you can do with MyChart, go to [email or web address].    Your next appointment:   12 month(s)  The format for your next appointment:   In Person  Provider:   James Hochrein, MD     

## 2022-06-18 DIAGNOSIS — Z6829 Body mass index (BMI) 29.0-29.9, adult: Secondary | ICD-10-CM | POA: Diagnosis not present

## 2022-06-18 DIAGNOSIS — F1120 Opioid dependence, uncomplicated: Secondary | ICD-10-CM | POA: Diagnosis not present

## 2022-06-18 DIAGNOSIS — M5416 Radiculopathy, lumbar region: Secondary | ICD-10-CM | POA: Diagnosis not present

## 2022-06-18 DIAGNOSIS — M961 Postlaminectomy syndrome, not elsewhere classified: Secondary | ICD-10-CM | POA: Diagnosis not present

## 2022-07-25 ENCOUNTER — Telehealth: Payer: PPO | Admitting: Physician Assistant

## 2022-07-25 DIAGNOSIS — J029 Acute pharyngitis, unspecified: Secondary | ICD-10-CM

## 2022-07-25 MED ORDER — AMOXICILLIN 500 MG PO TABS
500.0000 mg | ORAL_TABLET | Freq: Two times a day (BID) | ORAL | 0 refills | Status: AC
Start: 2022-07-25 — End: 2022-08-04

## 2022-07-25 NOTE — Progress Notes (Signed)

## 2022-07-25 NOTE — Progress Notes (Signed)
I have spent 5 minutes in review of e-visit questionnaire, review and updating patient chart, medical decision making and response to patient.   Jaimin Krupka Cody Sandara Tyree, PA-C    

## 2022-09-09 DIAGNOSIS — F1120 Opioid dependence, uncomplicated: Secondary | ICD-10-CM | POA: Diagnosis not present

## 2022-09-09 DIAGNOSIS — M961 Postlaminectomy syndrome, not elsewhere classified: Secondary | ICD-10-CM | POA: Diagnosis not present

## 2022-09-18 DIAGNOSIS — N3946 Mixed incontinence: Secondary | ICD-10-CM | POA: Diagnosis not present

## 2022-09-18 DIAGNOSIS — R350 Frequency of micturition: Secondary | ICD-10-CM | POA: Diagnosis not present

## 2022-10-22 ENCOUNTER — Telehealth: Payer: PPO | Admitting: Physician Assistant

## 2022-10-22 DIAGNOSIS — J019 Acute sinusitis, unspecified: Secondary | ICD-10-CM

## 2022-10-22 DIAGNOSIS — B9689 Other specified bacterial agents as the cause of diseases classified elsewhere: Secondary | ICD-10-CM | POA: Diagnosis not present

## 2022-10-22 MED ORDER — AMOXICILLIN-POT CLAVULANATE 875-125 MG PO TABS: 1.0000 | ORAL_TABLET | Freq: Two times a day (BID) | ORAL | 0 refills | Status: DC

## 2022-10-22 NOTE — Progress Notes (Signed)
I have spent 5 minutes in review of e-visit questionnaire, review and updating patient chart, medical decision making and response to patient.   Jiya Kissinger Cody Ioanna Colquhoun, PA-C    

## 2022-10-22 NOTE — Progress Notes (Signed)

## 2022-12-09 DIAGNOSIS — F1120 Opioid dependence, uncomplicated: Secondary | ICD-10-CM | POA: Diagnosis not present

## 2022-12-09 DIAGNOSIS — M961 Postlaminectomy syndrome, not elsewhere classified: Secondary | ICD-10-CM | POA: Diagnosis not present

## 2022-12-25 DIAGNOSIS — M4316 Spondylolisthesis, lumbar region: Secondary | ICD-10-CM | POA: Diagnosis not present

## 2022-12-25 DIAGNOSIS — M5136 Other intervertebral disc degeneration, lumbar region: Secondary | ICD-10-CM | POA: Diagnosis not present

## 2022-12-25 DIAGNOSIS — M961 Postlaminectomy syndrome, not elsewhere classified: Secondary | ICD-10-CM | POA: Diagnosis not present

## 2023-01-12 DIAGNOSIS — M47816 Spondylosis without myelopathy or radiculopathy, lumbar region: Secondary | ICD-10-CM | POA: Diagnosis not present

## 2023-01-13 ENCOUNTER — Other Ambulatory Visit: Payer: Self-pay | Admitting: Cardiology

## 2023-01-27 DIAGNOSIS — M47816 Spondylosis without myelopathy or radiculopathy, lumbar region: Secondary | ICD-10-CM | POA: Diagnosis not present

## 2023-02-17 DIAGNOSIS — M47816 Spondylosis without myelopathy or radiculopathy, lumbar region: Secondary | ICD-10-CM | POA: Diagnosis not present

## 2023-03-03 DIAGNOSIS — I9589 Other hypotension: Secondary | ICD-10-CM | POA: Diagnosis not present

## 2023-03-03 DIAGNOSIS — M5136 Other intervertebral disc degeneration, lumbar region: Secondary | ICD-10-CM | POA: Diagnosis not present

## 2023-03-03 DIAGNOSIS — E782 Mixed hyperlipidemia: Secondary | ICD-10-CM | POA: Diagnosis not present

## 2023-03-03 DIAGNOSIS — E063 Autoimmune thyroiditis: Secondary | ICD-10-CM | POA: Diagnosis not present

## 2023-03-03 DIAGNOSIS — E6609 Other obesity due to excess calories: Secondary | ICD-10-CM | POA: Diagnosis not present

## 2023-03-03 DIAGNOSIS — Z0001 Encounter for general adult medical examination with abnormal findings: Secondary | ICD-10-CM | POA: Diagnosis not present

## 2023-03-03 DIAGNOSIS — Z1331 Encounter for screening for depression: Secondary | ICD-10-CM | POA: Diagnosis not present

## 2023-03-03 DIAGNOSIS — E559 Vitamin D deficiency, unspecified: Secondary | ICD-10-CM | POA: Diagnosis not present

## 2023-03-03 DIAGNOSIS — Z6829 Body mass index (BMI) 29.0-29.9, adult: Secondary | ICD-10-CM | POA: Diagnosis not present

## 2023-03-11 DIAGNOSIS — F1120 Opioid dependence, uncomplicated: Secondary | ICD-10-CM | POA: Diagnosis not present

## 2023-03-11 DIAGNOSIS — M47816 Spondylosis without myelopathy or radiculopathy, lumbar region: Secondary | ICD-10-CM | POA: Diagnosis not present

## 2023-03-11 DIAGNOSIS — Z9689 Presence of other specified functional implants: Secondary | ICD-10-CM | POA: Diagnosis not present

## 2023-03-11 DIAGNOSIS — M961 Postlaminectomy syndrome, not elsewhere classified: Secondary | ICD-10-CM | POA: Diagnosis not present

## 2023-03-22 DIAGNOSIS — Z6831 Body mass index (BMI) 31.0-31.9, adult: Secondary | ICD-10-CM | POA: Diagnosis not present

## 2023-03-22 DIAGNOSIS — N952 Postmenopausal atrophic vaginitis: Secondary | ICD-10-CM | POA: Diagnosis not present

## 2023-03-22 DIAGNOSIS — Z1231 Encounter for screening mammogram for malignant neoplasm of breast: Secondary | ICD-10-CM | POA: Diagnosis not present

## 2023-03-22 DIAGNOSIS — Z124 Encounter for screening for malignant neoplasm of cervix: Secondary | ICD-10-CM | POA: Diagnosis not present

## 2023-03-22 DIAGNOSIS — N959 Unspecified menopausal and perimenopausal disorder: Secondary | ICD-10-CM | POA: Diagnosis not present

## 2023-05-05 DIAGNOSIS — H2511 Age-related nuclear cataract, right eye: Secondary | ICD-10-CM | POA: Diagnosis not present

## 2023-05-05 DIAGNOSIS — H16221 Keratoconjunctivitis sicca, not specified as Sjogren's, right eye: Secondary | ICD-10-CM | POA: Diagnosis not present

## 2023-05-23 ENCOUNTER — Telehealth: Payer: PPO | Admitting: Family Medicine

## 2023-05-23 DIAGNOSIS — R300 Dysuria: Secondary | ICD-10-CM

## 2023-05-23 MED ORDER — CEPHALEXIN 500 MG PO CAPS: 500.0000 mg | ORAL_CAPSULE | Freq: Two times a day (BID) | ORAL | 0 refills | Status: AC

## 2023-05-23 NOTE — Progress Notes (Signed)
E-Visit for Urinary Problems  We are sorry that you are not feeling well.  Here is how we plan to help!  Based on what you shared with me it looks like you most likely have a simple urinary tract infection.  A UTI (Urinary Tract Infection) is a bacterial infection of the bladder.  Most cases of urinary tract infections are simple to treat but a key part of your care is to encourage you to drink plenty of fluids and watch your symptoms carefully.  I have prescribed Keflex 500 mg twice a day for 7 days.  Your symptoms should gradually improve. Call us if the burning in your urine worsens, you develop worsening fever, back pain or pelvic pain or if your symptoms do not resolve after completing the antibiotic.  Urinary tract infections can be prevented by drinking plenty of water to keep your body hydrated.  Also be sure when you wipe, wipe from front to back and don't hold it in!  If possible, empty your bladder every 4 hours.  HOME CARE Drink plenty of fluids Compete the full course of the antibiotics even if the symptoms resolve Remember, when you need to go.go. Holding in your urine can increase the likelihood of getting a UTI! GET HELP RIGHT AWAY IF: You cannot urinate You get a high fever Worsening back pain occurs You see blood in your urine You feel sick to your stomach or throw up You feel like you are going to pass out  MAKE SURE YOU  Understand these instructions. Will watch your condition. Will get help right away if you are not doing well or get worse.   Thank you for choosing an e-visit.  Your e-visit answers were reviewed by a board certified advanced clinical practitioner to complete your personal care plan. Depending upon the condition, your plan could have included both over the counter or prescription medications.  Please review your pharmacy choice. Make sure the pharmacy is open so you can pick up prescription now. If there is a problem, you may contact your  provider through MyChart messaging and have the prescription routed to another pharmacy.  Your safety is important to us. If you have drug allergies check your prescription carefully.   For the next 24 hours you can use MyChart to ask questions about today's visit, request a non-urgent call back, or ask for a work or school excuse. You will get an email in the next two days asking about your experience. I hope that your e-visit has been valuable and will speed your recovery.    have provided 5 minutes of non face to face time during this encounter for chart review and documentation.   

## 2023-06-07 NOTE — Progress Notes (Unsigned)
  Cardiology Office Note:   Date:  06/10/2023  ID:  [name], DOB 1956/07/30, MRN [id number] PCP: Elfredia Nevins, MD  [facility] Providers Cardiologist:  Rollene Rotunda, MD {  History of Present Illness:   [name] is a 67 y.o. female  who presents for evaluation of hypotension. She has had low blood pressures with orthostatic symptoms.  She did have cortisol checked and this was normal.   She had Covid in August 2021.  Since I last saw her she is getting no further presyncope or syncope.  She really only takes her midodrine about once every morning.  She is taking care of some grandchildren after school.  She is getting ready for a horse show.  She has not been walking as much as she used to because of her time commitments.  ROS: As stated in the HPI and negative for all other systems.  Studies Reviewed:    EKG:   EKG Interpretation Date/Time:  Thursday June 10 2023 08:33:42 EDT Ventricular Rate:  53 PR Interval:  162 QRS Duration:  90 QT Interval:  418 QTC Calculation: 392 R Axis:   4  Text Interpretation: Sinus bradycardia Nonspecific T wave abnormality When compared with ECG of 06-Jun-2020 00:39,  rate is slower Confirmed by Rollene Rotunda ([id number]) on 06/10/2023 8:35:08 AM    Risk Assessment/Calculations:              Physical Exam:   VS:  BP 126/74 (BP Location: Left Arm, Patient Position: Sitting, Cuff Size: Normal)   Pulse (!) 53   Ht 5\' 8"  (1.727 m)   Wt 208 lb 12.8 oz (94.7 kg)   SpO2 98%   BMI 31.75 kg/m    Wt Readings from Last 3 Encounters:  06/10/23 208 lb 12.8 oz (94.7 kg)  06/04/22 191 lb (86.6 kg)  11/19/21 141 lb 12.1 oz (64.3 kg)     GEN: Well nourished, well developed in no acute distress NECK: No JVD; No carotid bruits CARDIAC: RRR, no murmurs, rubs, gallops RESPIRATORY:  Clear to auscultation without rales, wheezing or rhonchi  ABDOMEN: Soft, non-tender, non-distended EXTREMITIES:  No edema; No deformity   ASSESSMENT AND  PLAN:   HYPOTENSION:    She has no further symptoms.  At this point I think she can try the midodrine only as needed.  I do not think any further workup is indicated.   Bradycardia: She has had normal chronotropic competence on the monitor.  She wears her watch and has no alerts.  She has no symptoms.  No change in therapy.       Follow up with me as needed.   Signed, Rollene Rotunda, MD

## 2023-06-10 ENCOUNTER — Ambulatory Visit: Payer: PPO | Attending: Cardiology | Admitting: Cardiology

## 2023-06-10 ENCOUNTER — Encounter: Payer: Self-pay | Admitting: Cardiology

## 2023-06-10 VITALS — BP 126/74 | HR 53 | Ht 68.0 in | Wt 208.8 lb

## 2023-06-10 DIAGNOSIS — I951 Orthostatic hypotension: Secondary | ICD-10-CM

## 2023-06-10 NOTE — Patient Instructions (Signed)
Medication Instructions:   No changes       Follow-Up: At [facility], you and your health needs are our priority.  As part of our continuing mission to provide you with exceptional heart care, we have created designated Provider Care Teams.  These Care Teams include your primary Cardiologist (physician) and Advanced Practice Providers (APPs -  Physician Assistants and Nurse Practitioners) who all work together to provide you with the care you need, when you need it.     Your next appointment:   As needed  The format for your next appointment:   In Person  Provider:   Rollene Rotunda, MD

## 2023-06-18 DIAGNOSIS — B351 Tinea unguium: Secondary | ICD-10-CM | POA: Diagnosis not present

## 2023-06-18 DIAGNOSIS — Z6831 Body mass index (BMI) 31.0-31.9, adult: Secondary | ICD-10-CM | POA: Diagnosis not present

## 2023-06-18 DIAGNOSIS — J069 Acute upper respiratory infection, unspecified: Secondary | ICD-10-CM | POA: Diagnosis not present

## 2023-06-18 DIAGNOSIS — Z20828 Contact with and (suspected) exposure to other viral communicable diseases: Secondary | ICD-10-CM | POA: Diagnosis not present

## 2023-06-18 DIAGNOSIS — E6609 Other obesity due to excess calories: Secondary | ICD-10-CM | POA: Diagnosis not present

## 2023-06-18 DIAGNOSIS — R6889 Other general symptoms and signs: Secondary | ICD-10-CM | POA: Diagnosis not present

## 2023-06-22 DIAGNOSIS — F1120 Opioid dependence, uncomplicated: Secondary | ICD-10-CM | POA: Diagnosis not present

## 2023-06-22 DIAGNOSIS — M961 Postlaminectomy syndrome, not elsewhere classified: Secondary | ICD-10-CM | POA: Diagnosis not present

## 2023-06-22 DIAGNOSIS — M5416 Radiculopathy, lumbar region: Secondary | ICD-10-CM | POA: Diagnosis not present

## 2023-06-22 DIAGNOSIS — Z9689 Presence of other specified functional implants: Secondary | ICD-10-CM | POA: Diagnosis not present

## 2023-07-20 DIAGNOSIS — H2513 Age-related nuclear cataract, bilateral: Secondary | ICD-10-CM | POA: Diagnosis not present

## 2023-07-20 DIAGNOSIS — H16221 Keratoconjunctivitis sicca, not specified as Sjogren's, right eye: Secondary | ICD-10-CM | POA: Diagnosis not present

## 2023-08-17 ENCOUNTER — Encounter: Payer: Self-pay | Admitting: Adult Health

## 2023-08-17 NOTE — Progress Notes (Signed)
This encounter was created in error - please disregard.

## 2023-09-15 DIAGNOSIS — N3946 Mixed incontinence: Secondary | ICD-10-CM | POA: Diagnosis not present

## 2023-09-15 DIAGNOSIS — R350 Frequency of micturition: Secondary | ICD-10-CM | POA: Diagnosis not present

## 2023-09-24 DIAGNOSIS — M961 Postlaminectomy syndrome, not elsewhere classified: Secondary | ICD-10-CM | POA: Diagnosis not present

## 2023-09-24 DIAGNOSIS — F1120 Opioid dependence, uncomplicated: Secondary | ICD-10-CM | POA: Diagnosis not present

## 2023-09-24 DIAGNOSIS — Z9689 Presence of other specified functional implants: Secondary | ICD-10-CM | POA: Diagnosis not present

## 2023-10-12 ENCOUNTER — Encounter (HOSPITAL_COMMUNITY): Payer: Self-pay

## 2023-10-12 ENCOUNTER — Emergency Department (HOSPITAL_COMMUNITY)
Admission: EM | Admit: 2023-10-12 | Discharge: 2023-10-12 | Disposition: A | Discharge status: Home / Self Care | Payer: PPO | Attending: Emergency Medicine | Admitting: Emergency Medicine

## 2023-10-12 ENCOUNTER — Emergency Department (HOSPITAL_COMMUNITY): Payer: PPO

## 2023-10-12 ENCOUNTER — Telehealth: Payer: Self-pay | Admitting: Cardiology

## 2023-10-12 DIAGNOSIS — Z7982 Long term (current) use of aspirin: Secondary | ICD-10-CM | POA: Diagnosis not present

## 2023-10-12 DIAGNOSIS — R079 Chest pain, unspecified: Secondary | ICD-10-CM

## 2023-10-12 DIAGNOSIS — R0789 Other chest pain: Secondary | ICD-10-CM | POA: Diagnosis not present

## 2023-10-12 DIAGNOSIS — R110 Nausea: Secondary | ICD-10-CM | POA: Diagnosis not present

## 2023-10-12 HISTORY — DX: Pure hypercholesterolemia, unspecified: E78.00

## 2023-10-12 LAB — TROPONIN I (HIGH SENSITIVITY)
Troponin I (High Sensitivity): 3 ng/L (ref <18)
Troponin I (High Sensitivity): 3 ng/L (ref <18)

## 2023-10-12 LAB — CBC
HCT: 44.5 % (ref 36.0–46.0)
Hemoglobin: 14.5 g/dL (ref 12.0–15.0)
MCH: 29.6 pg (ref 26.0–34.0)
MCHC: 32.6 g/dL (ref 30.0–36.0)
MCV: 90.8 fL (ref 80.0–100.0)
Platelets: 189 10*3/uL (ref 150–400)
RBC: 4.9 MIL/uL (ref 3.87–5.11)
RDW: 13.9 % (ref 11.5–15.5)
WBC: 7.7 10*3/uL (ref 4.0–10.5)
nRBC: 0 % (ref 0.0–0.2)

## 2023-10-12 LAB — BASIC METABOLIC PANEL
Anion gap: 9 (ref 5–15)
BUN: 18 mg/dL (ref 8–23)
CO2: 27 mmol/L (ref 22–32)
Calcium: 9.6 mg/dL (ref 8.9–10.3)
Chloride: 105 mmol/L (ref 98–111)
Creatinine, Ser: 0.88 mg/dL (ref 0.44–1.00)
GFR, Estimated: >60 mL/min (ref >60)
Glucose, Bld: 105 mg/dL — ABNORMAL HIGH (ref 70–99)
Potassium: 4.3 mmol/L (ref 3.5–5.1)
Sodium: 141 mmol/L (ref 135–145)

## 2023-10-12 MED ORDER — NITROGLYCERIN 0.4 MG SL SUBL
0.4000 mg | SUBLINGUAL_TABLET | SUBLINGUAL | Status: DC | PRN
Start: 2023-10-12 — End: 2023-10-13

## 2023-10-12 MED ORDER — ASPIRIN 81 MG PO CHEW
324.0000 mg | CHEWABLE_TABLET | Freq: Once | ORAL | Status: AC
  Administered 2023-10-12: 324 mg via ORAL
  Filled 2023-10-12: qty 4

## 2023-10-12 MED ORDER — OMEPRAZOLE 20 MG PO CPDR: 20.0000 mg | DELAYED_RELEASE_CAPSULE | Freq: Every day | ORAL | 0 refills | Status: AC

## 2023-10-12 NOTE — Discharge Instructions (Addendum)
 You were seen for your chest pain in the emergency department.   At home, please continue to take your Prilosec and over-the-counter antacids for your heartburn since that is likely causing your symptoms.    Follow-up with your primary doctor in 2-3 days regarding your visit.  Cardiology will be calling you regarding an appointment within the next 72 hours.  You may contact them if you do not hear from them in that time using the information in this packet.  Return immediately to the emergency department if you experience any of the following: Worsening pain, difficulty breathing, unexplained vomiting or sweating, or any other concerning symptoms.    Thank you for visiting our Emergency Department. It was a pleasure taking care of you today.

## 2023-10-12 NOTE — Telephone Encounter (Signed)
   Pt c/o of Chest Pain: STAT if active CP, including tightness, pressure, jaw pain, radiating pain to shoulder/upper arm/back, CP unrelieved by Nitro. Symptoms reported of SOB, nausea, vomiting, sweating.  1. Are you having CP right now? No   2. Are you experiencing any other symptoms (ex. SOB, nausea, vomiting, sweating)? Nauseous (unsure if it is from back pain)    3. Is your CP continuous or coming and going? Coming and going   4. Have you taken Nitroglycerin ? No   5. How long have you been experiencing CP? Started Saturday night    6. If NO CP at time of call then end call with telling Pt to call back or call 911 if Chest pain returns prior to return call from triage team.

## 2023-10-12 NOTE — ED Triage Notes (Signed)
 Pt c/o indigestion and intermittent L chest pain and nausea 3 days and chronic low back pain.  Pain score 3/10.  Pt reports indigestion was relieved after taking Prilosec.

## 2023-10-12 NOTE — ED Provider Notes (Signed)
 [facility] EMERGENCY DEPARTMENT AT [facility] Provider Note   CSN: [id number] Arrival date & time: 10/12/23  1749     History  Chief Complaint  Patient presents with   Chest Pain   Back Pain   Nausea    [name] is a 68 y.o. female.  68 year old female with a history of hyperlipidemia who presents to the emergency department with chest pain.  Since Saturday has been having substernal chest tightness.  Also has occasional twinges of pain.  Says that she has some nausea with it and occasionally will feel like she is having indigestion.  Says that the pain radiates up towards her neck but no radiation to her back or arm.  Not exertional.  Not pleuritic.  Not worsened with position.  Called her outpatient doctor who referred her into the emergency department.  Says that the last time she had chest pain started just prior to coming into the emergency department but it is gone now.       Home Medications Prior to Admission medications   Medication Sig Start Date End Date Taking? Authorizing Provider  aspirin  EC 81 MG tablet Take 81 mg by mouth at bedtime.   Yes [provider]  celecoxib (CELEBREX) 200 MG capsule Take 200 mg by mouth 2 (two) times daily.   Yes [provider]  Cholecalciferol (VITAMIN D3) 250 MCG (10000 UT) capsule Take 10,000 Units by mouth daily.   Yes [provider]  estradiol (ESTRACE) 0.5 MG tablet Take 0.5 mg by mouth daily.   Yes [provider]  HYDROcodone-acetaminophen  (NORCO) 10-325 MG tablet Take 1 tablet by mouth every 4 (four) hours as needed for moderate pain.   Yes [provider]  LUTEIN PO Take 18 mg by mouth daily.   Yes [provider]  midodrine  (PROAMATINE ) 5 MG tablet TAKE 2 TABLETS IN THE MORNING, 2 TABLETS AT LUNCH, AND 2 TABLET IN THE EVENING (5 HOURS PRIOR TO BEDTIME). Patient taking differently: Take 5 mg by mouth 3 (three) times daily with meals. 01/13/23  Yes Lavona Agent, MD  Omega-3 Fatty Acids (FISH OIL) 1000 MG CAPS Take 1,000 mg by mouth daily.   Yes [provider]  omeprazole  (PRILOSEC) 20 MG capsule Take 1 capsule (20 mg total) by mouth daily. 10/12/23 11/11/23 Yes Yolande Lamar BROCKS, MD  RESTASIS 0.05 % ophthalmic emulsion Place 1 drop into both eyes 2 (two) times daily. 05/14/23  Yes [provider]  rosuvastatin (CRESTOR) 10 MG tablet Take 10 mg by mouth daily.   Yes [provider]  terbinafine (LAMISIL) 250 MG tablet Take 250 mg by mouth in the morning and at bedtime. 10/07/23  Yes [provider]  tolterodine (DETROL LA) 4 MG 24 hr capsule Take 4 mg by mouth daily.   Yes [provider]  TURMERIC PO Take 1,000 mg by mouth in the morning and at bedtime.   Yes [provider]      Allergies    2,4-d dimethylamine; Balsam; Methylisothiazolinone; Propolis; and Shellac    Review of Systems   Review of Systems  Physical Exam Updated Vital Signs BP 129/88   Pulse 65   Temp 99.1 F (37.3 C) (Oral)   Resp 14   Ht 5' 8 (1.727 m)   Wt 88.5 kg   SpO2 99%   BMI 29.65 kg/m  Physical Exam Vitals and nursing note reviewed.  Constitutional:      General: She is not  in acute distress.    Appearance: She is well-developed.  HENT:     Head: Normocephalic and atraumatic.     Right Ear: External ear normal.     Left Ear: External ear normal.     Nose: Nose normal.  Eyes:     Extraocular Movements: Extraocular movements intact.     Conjunctiva/sclera: Conjunctivae normal.     Pupils: Pupils are equal, round, and reactive to light.  Cardiovascular:     Rate and Rhythm: Normal rate and regular rhythm.     Heart sounds: No murmur heard.    Comments: Radial pulses 2+ bilaterally Pulmonary:     Effort: Pulmonary effort is normal. No respiratory distress.     Breath sounds: Normal breath sounds.  Abdominal:     General: Abdomen is flat. There is no distension.     Palpations: Abdomen is soft.  There is no mass.     Tenderness: There is no abdominal tenderness. There is no guarding.  Musculoskeletal:     Cervical back: Normal range of motion and neck supple.     Right lower leg: No edema.     Left lower leg: No edema.  Skin:    General: Skin is warm and dry.  Neurological:     Mental Status: She is alert and oriented to person, place, and time. Mental status is at baseline.  Psychiatric:        Mood and Affect: Mood normal.     ED Results / Procedures / Treatments   Labs (all labs ordered are listed, but only abnormal results are displayed) Labs Reviewed  BASIC METABOLIC PANEL - Abnormal; Notable for the following components:      Result Value   Glucose, Bld 105 (*)    All other components within normal limits  CBC  TROPONIN I (HIGH SENSITIVITY)  TROPONIN I (HIGH SENSITIVITY)    EKG EKG Interpretation Date/Time:  Tuesday October 12 2023 18:02:18 EST Ventricular Rate:  72 PR Interval:  148 QRS Duration:  86 QT Interval:  390 QTC Calculation: 427 R Axis:   13  Text Interpretation: Normal sinus rhythm Low voltage QRS Borderline ECG Confirmed by Yolande Charleston [phone]) on 10/12/2023 6:34:09 PM  Radiology DG Chest 2 View Result Date: 10/12/2023 CLINICAL DATA:  Chest pain and nausea over the last 3 days. EXAM: CHEST - 2 VIEW COMPARISON:  06/06/2020 FINDINGS: The lungs appear clear. Cardiac and mediastinal contours normal. No blunting of the costophrenic angles. Dorsal column stimulator leads project over the lower thoracic spinal canal posteriorly. Mild midthoracic spondylosis. IMPRESSION: 1. No active cardiopulmonary disease is radiographically apparent. 2. Dorsal column stimulator leads project over the lower thoracic spinal canal posteriorly. Electronically Signed   By: Ryan Salvage M.D.   On: 10/12/2023 18:43    Procedures Procedures    Medications Ordered in ED Medications  aspirin  chewable tablet 324 mg (324 mg Oral Given 10/12/23 1943)    ED Course/  Medical Decision Making/ A&P                                 Medical Decision Making Amount and/or Complexity of Data Reviewed Labs: ordered. Radiology: ordered.  Risk OTC drugs. Prescription drug management.   [name] is a 68 y.o. female with comorbidities that complicate the patient evaluation including hyperlipidemia who presents emergency department chest pain  Initial Ddx:  MI, PE, pneumonia, dissection, pericarditis, costochondritis, reflux  MDM:  With the patient's chest discomfort will obtain EKG and troponins to evaluate for MI.  Given the description of symptoms and her risk factors feel that pulmonary embolism is less likely.  Considered dissection but with their symmetric pulses, history, and description of the pain feel it is less likely.  If chest x-ray reveals widened mediastinum or any other concerning findings will consider CTA.  Also considered pericarditis but description is unlikely and they do not have risk factors for this diagnosis.  Chest pain not reproducible so feel it costochondritis less likely.  No infectious symptoms to suggest pneumonia at this time that would be causing pleuritic chest pain.  Plan:  Labs Aspirin  Troponin EKG Chest x-ray  ED Summary/Re-evaluation:  EKG without STEMI.  Serial troponins without elevation and were both WNL.  Patient's symptoms had improved significantly since coming into the emergency department.  Chest x-ray unremarkable and the remainder of her labs were unremarkable.  I suspect that she had reflux that was causing her symptoms.  Will have her follow-up with her primary doctor.  With her age and the fact that she does have a family history of MI we will have her follow-up with cardiology for additional evaluation but do not feel that she is currently having ACS.  This patient presents to the ED for concern of complaints listed in HPI, this involves an extensive number of treatment options, and is a complaint that  carries with it a high risk of complications and morbidity. Disposition including potential need for admission considered.   Dispo: DC Home. Return precautions discussed including, but not limited to, those listed in the AVS. Allowed pt time to ask questions which were answered fully prior to dc.  Additional history obtained from spouse Records reviewed Outpatient Clinic Notes The following labs were independently interpreted: Chemistry and show no acute abnormality I independently reviewed the following imaging with scope of interpretation limited to determining acute life threatening conditions related to emergency care: Chest x-ray and agree with the radiologist interpretation with the following exceptions: none I personally reviewed and interpreted cardiac monitoring: normal sinus rhythm  I personally reviewed and interpreted the pt's EKG: see above for interpretation  I have reviewed the patients home medications and made adjustments as needed   Final Clinical Impression(s) / ED Diagnoses Final diagnoses:  Chest pain, unspecified type    Rx / DC Orders ED Discharge Orders          Ordered    Ambulatory referral to Cardiology        10/12/23 2116    omeprazole  (PRILOSEC) 20 MG capsule  Daily        10/12/23 2118              Yolande Lamar BROCKS, MD 10/13/23 1105

## 2023-10-12 NOTE — ED Provider Triage Note (Signed)
 Emergency Medicine Provider Triage Evaluation Note  ASHLYND MICHNA , a 68 y.o. female  was evaluated in triage.  Pt complains of chest heaviness and reflux sensation since Saturday. Non-exertional. No personal hx of MI. Family hx of heart attacks. Talked to OP dr who referred her to the ED.   Review of Systems  Positive: CP Negative: Diaphoresis, vomiting  Physical Exam  BP (!) 152/83   Pulse 69   Temp 99.1 F (37.3 C) (Oral)   Resp 18   Ht 5' 8 (1.727 m)   Wt 88.5 kg   SpO2 99%   BMI 29.65 kg/m  Gen:   Awake, no distress   Resp:  Normal effort  MSK:   Moves extremities without difficulty  Other:    Medical Decision Making  Medically screening exam initiated at 6:37 PM.  Appropriate orders placed.  KRISTA GODSIL was informed that the remainder of the evaluation will be completed by another provider, this initial triage assessment does not replace that evaluation, and the importance of remaining in the ED until their evaluation is complete.   Yolande Lamar BROCKS, MD 10/12/23 RONOLD

## 2023-10-12 NOTE — Telephone Encounter (Signed)
 Called patient and verified name and DOB. Patient states she has no cardiac history. Previously seen Dr Lavona for hypotension.  Starting Saturday night she started having intermittent chest pain. She does not have any nitroglycerin  as never prescribed for her. Had resolved on own and then randomly comes back. States 4/10 and describes as heaviness and tightness. Last episode one hour ago. States father and brother have cardiac history. Also states had really bad feeling of heart burn on Saturday that still comes and goes. Advised I will discuss with DOD and call her back.

## 2023-10-12 NOTE — ED Notes (Signed)
 Patient transported to X-ray

## 2023-10-12 NOTE — Telephone Encounter (Signed)
 Called patient back and advised her to to local ER. She confirmed that she would go to [facility] and I advised her that they are our sister facility and would transfer her here if level of care warranted.

## 2023-10-13 NOTE — Progress Notes (Signed)
  Cardiology Office Note:   Date:  10/14/2023  ID:  [name], DOB 1956-05-24, MRN [id number] PCP: Bertell Satterfield, MD  [facility] Providers Cardiologist:  Lynwood Schilling, MD {  History of Present Illness:   [name] is a 68 y.o. female who I saw previously for treatment of orthostatic hypotension.  She also had some bradycardia.  These are being treated symptomatically and she was supposed to follow-up as needed.  She was in the emergency room couple of days ago with chest discomfort.  I reviewed these records for this visit.  Enzymes were negative.  EKG was unremarkable.  She was treated with aspirin .  I do not see a clear etiology. Identified at that time.   She said that the discomfort started about a week ago.  Has been on and off.  It was severe 1 night and she woke up from being asleep and felt very nauseated.  She thought it felt like heartburn.  She took a Prilosec.  However, she then developed some heaviness which is what concerned her.  She went to the emergency room.  Since then she has been having this sporadically on and off.  It comes on without exertion.  She does some light household chores being somewhat limited by her back but cannot bring it on with that.  This discomfort is about 4 out of 10 in intensity and last for about 3 to 4 minutes.  There is no associated nausea vomiting or diaphoresis.  She is not having any palpitations.  She is not having any new shortness of breath, PND or orthopnea.  She has not had this discomfort before.    ROS: Mild neck tingling. Otherwise as stated in the HPI and negative for all other systems.  Studies Reviewed:    EKG:   Sinus rhythm, rate 72, axis within normal limits, intervals within normal limits, no acute ST-T wave changes.  Risk Assessment/Calculations:              Physical Exam:   VS:  BP 124/72 (BP Location: Left Arm, Patient Position: Sitting, Cuff Size: Normal)   Pulse 72   Ht 5' 8 (1.727 m)   BMI 29.65  kg/m    Wt Readings from Last 3 Encounters:  10/12/23 195 lb (88.5 kg)  06/10/23 208 lb 12.8 oz (94.7 kg)  06/04/22 191 lb (86.6 kg)     GEN: Well nourished, well developed in no acute distress NECK: No JVD; No carotid bruits CARDIAC: RRR, no murmurs, rubs, gallops RESPIRATORY:  Clear to auscultation without rales, wheezing or rhonchi  ABDOMEN: Soft, non-tender, non-distended EXTREMITIES:  No edema; No deformity   ASSESSMENT AND PLAN:    Chest pain: Chest discomfort has predominantly nonanginal components.  I think the pretest probability of obstructive coronary disease is low but I would like to screen her with a POET (Plain Old Exercise Treadmill).  If this is negative I would not suggest further cardiac workup.  Orthostatic hypotension: This actually seems to be doing well at this point on the meds as listed.  No change in therapy.     Follow up with me as needed and based on the results of the above testing.  Signed, Lynwood Schilling, MD

## 2023-10-14 ENCOUNTER — Ambulatory Visit: Payer: PPO | Attending: Cardiology | Admitting: Cardiology

## 2023-10-14 ENCOUNTER — Encounter: Payer: Self-pay | Admitting: Cardiology

## 2023-10-14 VITALS — BP 124/72 | HR 72 | Ht 68.0 in

## 2023-10-14 DIAGNOSIS — R072 Precordial pain: Secondary | ICD-10-CM | POA: Diagnosis not present

## 2023-10-14 NOTE — Patient Instructions (Signed)
 Medication Instructions:  No changes.  *If you need a refill on your cardiac medications before your next appointment, please call your pharmacy*   Testing/Procedures: Will schedule be at [address] Your physician has requested that you have an exercise tolerance test.. An exercise tolerance test is a test to check how your heart works during exercise. You will need to walk on a treadmill or for this test. An electrocardiogram (ECG) will record your heartbeat when you are at rest and when you are exercising.  Please also follow instruction sheet, as given.   Do not drink or eat foods with caffeine for 24 hours before the test. (Chocolate, coffee, tea, or energy drinks) If you use an inhaler, bring it with you to the test. Do not smoke for 4 hours before the test. Wear comfortable shoes and clothing.     Follow-Up: At [facility], you and your health needs are our priority.  As part of our continuing mission to provide you with exceptional heart care, we have created designated Provider Care Teams.  These Care Teams include your primary Cardiologist (physician) and Advanced Practice Providers (APPs -  Physician Assistants and Nurse Practitioners) who all work together to provide you with the care you need, when you need it.  We recommend signing up for the patient portal called "MyChart".  Sign up information is provided on this After Visit Summary.  MyChart is used to connect with patients for Virtual Visits (Telemedicine).  Patients are able to view lab/test results, encounter notes, upcoming appointments, etc.  Non-urgent messages can be sent to your provider as well.   To learn more about what you can do with MyChart, go to [email or web address].    Your next appointment:    As needed.  Provider:   Rollene Rotunda, MD

## 2023-10-20 ENCOUNTER — Telehealth (HOSPITAL_COMMUNITY): Payer: Self-pay | Admitting: *Deleted

## 2023-10-20 NOTE — Telephone Encounter (Signed)
 Reminder call given for upcoming GXT on 10/26/23 at 8:00.

## 2023-10-26 ENCOUNTER — Ambulatory Visit (HOSPITAL_COMMUNITY): Payer: PPO | Attending: Cardiology

## 2023-10-26 DIAGNOSIS — R072 Precordial pain: Secondary | ICD-10-CM | POA: Diagnosis not present

## 2023-10-26 LAB — EXERCISE TOLERANCE TEST
Estimated workload: 7
Exercise duration (min): 5 min
Exercise duration (sec): 11 s
MPHR: 155 {beats}/min
Peak HR: 131 {beats}/min
Percent HR: 84 %
Rest HR: 65 {beats}/min
ST Depression (mm): 2 mm

## 2023-11-08 ENCOUNTER — Other Ambulatory Visit: Payer: Self-pay

## 2023-11-08 ENCOUNTER — Telehealth: Payer: Self-pay

## 2023-11-08 DIAGNOSIS — R9439 Abnormal result of other cardiovascular function study: Secondary | ICD-10-CM

## 2023-11-08 MED ORDER — METOPROLOL TARTRATE 100 MG PO TABS: 100.0000 mg | ORAL_TABLET | Freq: Once | ORAL | 0 refills | Status: DC

## 2023-11-08 NOTE — Telephone Encounter (Signed)
Rollene Rotunda, MD  Jeannette How A, RN Unfortunately, this test is not diagnostic as there was artifact and it was difficult to read.  It was read as positive.  I think it is inconclusive.   She needs a coronary CTA to exclude obstructive CAD.  Indication positive stress test.        Called and left VMM with patient letting her know that Dr Antoine Poche as ordered a coronary Cta for her which I have placed the order and have printed an addendum AVS and placed in the mail with her echo results. Left call back number to call with any questions. Message to pre auth pool as well.    Your cardiac CT will be scheduled at one of the below locations:   [facility] [address] [place], [place] [address] [phone]   If scheduled at [facility], please arrive at the [facility] and Children's Entrance (Entrance C2) of [facility] 30 minutes prior to test start time. You can use the FREE valet parking offered at entrance C (encouraged to control the heart rate for the test)  Proceed to the [facility] Radiology Department (first floor) to check-in and test prep.  All radiology patients and guests should use entrance C2 at [facility], accessed from [facility], even though the hospital's physical address listed is [address]    Please follow these instructions carefully (unless otherwise directed):  An IV will be required for this test and Nitroglycerin will be given.  Hold all erectile dysfunction medications at least 3 days (72 hrs) prior to test. (Ie viagra, cialis, sildenafil, tadalafil, etc)   On the Night Before the Test: Be sure to Drink plenty of water. Do not consume any caffeinated/decaffeinated beverages or chocolate 12 hours prior to your test. Do not take any antihistamines 12 hours prior to your test. On the Day of the Test: Drink plenty of water until 1 hour prior to the test. Do not eat any food 1 hour prior to  test. You may take your regular medications prior to the test.  Take metoprolol (Lopressor) two hours prior to test.  Your one time dose of this medication script has been sent to [facility] for pick up. If you take Furosemide/Hydrochlorothiazide/Spironolactone/Chlorthalidone, please HOLD on the morning of the test. Patients who wear a continuous glucose monitor MUST remove the device prior to scanning. FEMALES- please wear underwire-free bra if available, avoid dresses & tight clothing       After the Test: Drink plenty of water. After receiving IV contrast, you may experience a mild flushed feeling. This is normal. On occasion, you may experience a mild rash up to 24 hours after the test. This is not dangerous. If this occurs, you can take Benadryl 25 mg, Zyrtec, Claritin, or Allegra and increase your fluid intake. (Patients taking Tikosyn should avoid Benadryl, and may take Zyrtec, Claritin, or Allegra) If you experience trouble breathing, this can be serious. If it is severe call 911 IMMEDIATELY. If it is mild, please call our office.  We will call to schedule your test 2-4 weeks out understanding that some insurance companies will need an authorization prior to the service being performed.   For more information and frequently asked questions, please visit our website : [email or web address]  For non-scheduling related questions, please contact the cardiac imaging nurse navigator should you have any questions/concerns: Cardiac Imaging Nurse Navigators Direct Office Dial: [phone]   For scheduling  needs, including cancellations and rescheduling, please call [place], [phone].

## 2023-11-08 NOTE — Telephone Encounter (Signed)
-----   Message from Rollene Rotunda sent at 11/06/2023  1:37 PM EST ----- Unfortunately, this test is not diagnostic as there was artifact and it was difficult to read.  It was read as positive.  I think it is inconclusive.   She needs a coronary CTA to exclude obstructive CAD.  Indication positive stress test.  Call Ms. Cain Saupe with the results and send results to Elfredia Nevins, MD

## 2023-11-18 ENCOUNTER — Telehealth (HOSPITAL_COMMUNITY): Payer: Self-pay | Admitting: *Deleted

## 2023-11-18 NOTE — Telephone Encounter (Signed)
Reaching out to patient to offer assistance regarding upcoming cardiac imaging study; pt verbalizes understanding of appt date/time, parking situation and where to check in, pre-test NPO status and medications ordered, and verified current allergies; name and call back number provided for further questions should they arise Johney Frame RN Navigator Cardiac Imaging [facility] [phone] office [phone] cell

## 2023-11-19 ENCOUNTER — Ambulatory Visit (HOSPITAL_COMMUNITY)
Admission: RE | Admit: 2023-11-19 | Discharge: 2023-11-19 | Disposition: A | Discharge status: Home / Self Care | Payer: HMO | Source: Ambulatory Visit | Attending: Cardiology | Admitting: Cardiology

## 2023-11-19 DIAGNOSIS — I2510 Atherosclerotic heart disease of native coronary artery without angina pectoris: Secondary | ICD-10-CM | POA: Insufficient documentation

## 2023-11-19 DIAGNOSIS — R9439 Abnormal result of other cardiovascular function study: Secondary | ICD-10-CM | POA: Diagnosis not present

## 2023-11-19 DIAGNOSIS — R9430 Abnormal result of cardiovascular function study, unspecified: Secondary | ICD-10-CM | POA: Diagnosis not present

## 2023-11-19 MED ORDER — NITROGLYCERIN 0.4 MG SL SUBL
SUBLINGUAL_TABLET | SUBLINGUAL | Status: AC
  Filled 2023-11-19: qty 1

## 2023-11-19 MED ORDER — NITROGLYCERIN 0.4 MG SL SUBL
0.8000 mg | SUBLINGUAL_TABLET | Freq: Once | SUBLINGUAL | Status: AC
  Administered 2023-11-19: 0.8 mg via SUBLINGUAL

## 2023-11-19 MED ORDER — IOHEXOL 350 MG/ML SOLN
95.0000 mL | Freq: Once | INTRAVENOUS | Status: AC | PRN
  Administered 2023-11-19: 95 mL via INTRAVENOUS

## 2023-11-29 ENCOUNTER — Telehealth: Payer: Self-pay | Admitting: Cardiology

## 2023-11-29 NOTE — Telephone Encounter (Signed)
 Rollene Rotunda, MD 11/28/2023  2:22 PM EST     There was no obstructive coronary disease.  There is some coronary calcium and some mild plaque.  However, this is not causing symptoms and no further testing is indicated.  She needs continued risk reduction.  I am also waiting for the radiology over read. Call Ms. [name] with the results and send results to Elfredia Nevins, MD    Patient identification verified by 2 forms. Brooke Rail, RN    Called and spoke to patient Relayed result message  Informed patient additional call maybe completed depending on radiology over read  Advised patient to outreach with concerns  Patient verbalized understanding, no questions at this time

## 2023-11-29 NOTE — Telephone Encounter (Signed)
Pt called in for CT results

## 2023-12-23 DIAGNOSIS — M961 Postlaminectomy syndrome, not elsewhere classified: Secondary | ICD-10-CM | POA: Diagnosis not present

## 2023-12-23 DIAGNOSIS — F1120 Opioid dependence, uncomplicated: Secondary | ICD-10-CM | POA: Diagnosis not present

## 2023-12-23 DIAGNOSIS — Z9689 Presence of other specified functional implants: Secondary | ICD-10-CM | POA: Diagnosis not present

## 2024-01-18 DIAGNOSIS — H2513 Age-related nuclear cataract, bilateral: Secondary | ICD-10-CM | POA: Diagnosis not present

## 2024-01-18 DIAGNOSIS — H16221 Keratoconjunctivitis sicca, not specified as Sjogren's, right eye: Secondary | ICD-10-CM | POA: Diagnosis not present

## 2024-01-18 DIAGNOSIS — H16141 Punctate keratitis, right eye: Secondary | ICD-10-CM | POA: Diagnosis not present

## 2024-03-01 DIAGNOSIS — M25512 Pain in left shoulder: Secondary | ICD-10-CM | POA: Diagnosis not present

## 2024-03-16 DIAGNOSIS — M25512 Pain in left shoulder: Secondary | ICD-10-CM | POA: Diagnosis not present

## 2024-03-21 DIAGNOSIS — M961 Postlaminectomy syndrome, not elsewhere classified: Secondary | ICD-10-CM | POA: Diagnosis not present

## 2024-03-21 DIAGNOSIS — F1120 Opioid dependence, uncomplicated: Secondary | ICD-10-CM | POA: Diagnosis not present

## 2024-03-21 DIAGNOSIS — Z9689 Presence of other specified functional implants: Secondary | ICD-10-CM | POA: Diagnosis not present

## 2024-03-27 DIAGNOSIS — Z131 Encounter for screening for diabetes mellitus: Secondary | ICD-10-CM | POA: Diagnosis not present

## 2024-03-27 DIAGNOSIS — Z1329 Encounter for screening for other suspected endocrine disorder: Secondary | ICD-10-CM | POA: Diagnosis not present

## 2024-03-27 DIAGNOSIS — Z01419 Encounter for gynecological examination (general) (routine) without abnormal findings: Secondary | ICD-10-CM | POA: Diagnosis not present

## 2024-03-27 DIAGNOSIS — Z1321 Encounter for screening for nutritional disorder: Secondary | ICD-10-CM | POA: Diagnosis not present

## 2024-03-27 DIAGNOSIS — N951 Menopausal and female climacteric states: Secondary | ICD-10-CM | POA: Diagnosis not present

## 2024-03-27 DIAGNOSIS — Z13228 Encounter for screening for other metabolic disorders: Secondary | ICD-10-CM | POA: Diagnosis not present

## 2024-03-27 DIAGNOSIS — Z1231 Encounter for screening mammogram for malignant neoplasm of breast: Secondary | ICD-10-CM | POA: Diagnosis not present

## 2024-03-27 DIAGNOSIS — Z130 Encounter for screening for diseases of the blood and blood-forming organs and certain disorders involving the immune mechanism: Secondary | ICD-10-CM | POA: Diagnosis not present

## 2024-03-27 DIAGNOSIS — Z6834 Body mass index (BMI) 34.0-34.9, adult: Secondary | ICD-10-CM | POA: Diagnosis not present

## 2024-03-27 DIAGNOSIS — N952 Postmenopausal atrophic vaginitis: Secondary | ICD-10-CM | POA: Diagnosis not present

## 2024-03-27 DIAGNOSIS — E669 Obesity, unspecified: Secondary | ICD-10-CM | POA: Diagnosis not present

## 2024-04-24 DIAGNOSIS — M25512 Pain in left shoulder: Secondary | ICD-10-CM | POA: Diagnosis not present

## 2024-04-24 DIAGNOSIS — M47812 Spondylosis without myelopathy or radiculopathy, cervical region: Secondary | ICD-10-CM | POA: Insufficient documentation

## 2024-05-02 ENCOUNTER — Other Ambulatory Visit (HOSPITAL_COMMUNITY): Payer: Self-pay | Admitting: Internal Medicine

## 2024-05-02 ENCOUNTER — Ambulatory Visit (HOSPITAL_COMMUNITY)
Admission: RE | Admit: 2024-05-02 | Discharge: 2024-05-02 | Disposition: A | Discharge status: Home / Self Care | Source: Ambulatory Visit | Attending: Internal Medicine | Admitting: Internal Medicine

## 2024-05-02 DIAGNOSIS — M5412 Radiculopathy, cervical region: Secondary | ICD-10-CM | POA: Insufficient documentation

## 2024-05-02 DIAGNOSIS — M51369 Other intervertebral disc degeneration, lumbar region without mention of lumbar back pain or lower extremity pain: Secondary | ICD-10-CM | POA: Diagnosis not present

## 2024-05-02 DIAGNOSIS — M50323 Other cervical disc degeneration at C6-C7 level: Secondary | ICD-10-CM | POA: Diagnosis not present

## 2024-05-02 DIAGNOSIS — Z6832 Body mass index (BMI) 32.0-32.9, adult: Secondary | ICD-10-CM | POA: Diagnosis not present

## 2024-05-02 DIAGNOSIS — E6609 Other obesity due to excess calories: Secondary | ICD-10-CM | POA: Diagnosis not present

## 2024-05-15 ENCOUNTER — Other Ambulatory Visit (HOSPITAL_COMMUNITY): Payer: Self-pay | Admitting: Internal Medicine

## 2024-05-15 DIAGNOSIS — M5412 Radiculopathy, cervical region: Secondary | ICD-10-CM

## 2024-05-15 DIAGNOSIS — M51369 Other intervertebral disc degeneration, lumbar region without mention of lumbar back pain or lower extremity pain: Secondary | ICD-10-CM

## 2024-05-22 ENCOUNTER — Ambulatory Visit (HOSPITAL_COMMUNITY)
Admission: RE | Admit: 2024-05-22 | Discharge: 2024-05-22 | Disposition: A | Discharge status: Home / Self Care | Source: Ambulatory Visit | Attending: Internal Medicine | Admitting: Internal Medicine

## 2024-05-22 DIAGNOSIS — M5136 Other intervertebral disc degeneration, lumbar region with discogenic back pain only: Secondary | ICD-10-CM | POA: Diagnosis not present

## 2024-05-22 DIAGNOSIS — M4802 Spinal stenosis, cervical region: Secondary | ICD-10-CM | POA: Diagnosis not present

## 2024-05-22 DIAGNOSIS — Z9229 Personal history of other drug therapy: Secondary | ICD-10-CM | POA: Diagnosis not present

## 2024-05-22 DIAGNOSIS — Z0001 Encounter for general adult medical examination with abnormal findings: Secondary | ICD-10-CM | POA: Diagnosis not present

## 2024-05-22 DIAGNOSIS — M51369 Other intervertebral disc degeneration, lumbar region without mention of lumbar back pain or lower extremity pain: Secondary | ICD-10-CM | POA: Diagnosis not present

## 2024-05-22 DIAGNOSIS — Z1331 Encounter for screening for depression: Secondary | ICD-10-CM | POA: Diagnosis not present

## 2024-05-22 DIAGNOSIS — E6609 Other obesity due to excess calories: Secondary | ICD-10-CM | POA: Diagnosis not present

## 2024-05-22 DIAGNOSIS — M5412 Radiculopathy, cervical region: Secondary | ICD-10-CM | POA: Diagnosis not present

## 2024-05-22 DIAGNOSIS — Z6832 Body mass index (BMI) 32.0-32.9, adult: Secondary | ICD-10-CM | POA: Diagnosis not present

## 2024-05-22 DIAGNOSIS — M47812 Spondylosis without myelopathy or radiculopathy, cervical region: Secondary | ICD-10-CM | POA: Diagnosis not present

## 2024-05-22 DIAGNOSIS — M542 Cervicalgia: Secondary | ICD-10-CM | POA: Diagnosis not present

## 2024-05-23 LAB — LIPID PANEL
Cholesterol: 182 (ref 0–200)
HDL: 53 (ref 35–70)
LDL Cholesterol: 104
Triglycerides: 145 (ref 40–160)

## 2024-05-23 LAB — CBC AND DIFFERENTIAL
HCT: 46 (ref 36–46)
Hemoglobin: 14.4 (ref 12.0–16.0)
Platelets: 166 K/uL (ref 150–400)
WBC: 5.1

## 2024-05-23 LAB — BASIC METABOLIC PANEL WITH GFR
Creatinine: 0.8 (ref 0.5–1.1)
Potassium: 4.6 meq/L (ref 3.5–5.1)
Sodium: 141 (ref 137–147)

## 2024-05-23 LAB — COMPREHENSIVE METABOLIC PANEL WITH GFR
Calcium: 9.8 (ref 8.7–10.7)
eGFR: 88

## 2024-05-23 LAB — HEPATIC FUNCTION PANEL
ALT: 29 U/L (ref 7–35)
AST: 21 (ref 13–35)
Alkaline Phosphatase: 90 (ref 25–125)

## 2024-05-23 LAB — CBC: RBC: 5.06 (ref 3.87–5.11)

## 2024-06-06 DIAGNOSIS — M542 Cervicalgia: Secondary | ICD-10-CM | POA: Diagnosis not present

## 2024-06-22 DIAGNOSIS — M5412 Radiculopathy, cervical region: Secondary | ICD-10-CM | POA: Diagnosis not present

## 2024-06-26 NOTE — Therapy (Signed)
 OUTPATIENT PHYSICAL THERAPY EVALUATION   Patient Name: [name] MRN: [id number] DOB:Feb 09, 1956, 68 y.o., female Today's Date: 06/28/2024  END OF SESSION:  PT End of Session - 06/28/24 1342     Visit Number 1    Number of Visits 10    Date for Recertification  08/23/24    PT Start Time 1300    PT Stop Time 1343    PT Time Calculation (min) 43 min    Activity Tolerance Patient tolerated treatment well    Behavior During Therapy [facility] for tasks assessed/performed          Past Medical History:  Diagnosis Date   Chronic back pain    Complication of anesthesia    shaking and chattery   High cholesterol    Past Surgical History:  Procedure Laterality Date   8 back surgeries     COLONOSCOPY  10/14/2011   Procedure: COLONOSCOPY;  Surgeon: Claudis RAYMOND Rivet, MD;  [facility];  Service: Endoscopy;  Laterality: N/A;  1030   COLONOSCOPY WITH PROPOFOL  N/A 11/19/2021   Procedure: COLONOSCOPY WITH PROPOFOL ;  Surgeon: Barnes Claudis RAYMOND, MD;  [facility];  Service: Endoscopy;  Laterality: N/A;  10:00   left foot surgery     PARTIAL HYSTERECTOMY     Patient Active Problem List   Diagnosis Date Noted   Orthostatic hypotension 06/03/2022   Educated about COVID-19 virus infection 02/23/2019   Lumbar spondylosis 03/29/2018   Idiopathic hypotension 12/20/2015   Chronic back pain 09/21/2011    PCP: Bertell Satterfield, MD   REFERRING PROVIDER: Bertell Satterfield, MD   REFERRING DIAG: M54.2 (ICD-10-CM) - Cervicalgia   Rationale for Evaluation and Treatment:  Rehabiliation  THERAPY DIAG:  Cervicalgia  Chronic left shoulder pain  ONSET DATE: 1 month of increased pain, chronic pain   SUBJECTIVE:                                                                                                                                                                                           SUBJECTIVE STATEMENT: She did have history of cervical fusion in 1998, and spinal  stimulator 2004. She relays chronic neck pain and pain into her left shoulder that starts at the base of her skull down to scapular. This has been getting worse for last month, she did have injection last week.   PERTINENT HISTORY:  See above PMH, She did have history of cervical fusion in 1998, and spinal stimulator 2004, 8 back surgeries  PAIN:  NPRS scale: 5/10 upon arrival Pain location:neck and left shoulder Pain description: constant unless laying down, burning pain and  sharp Aggravating factors: being up, not laying down, bringing arm behind back Relieving factors: rest by laying down, no improvement from ice or heat   PRECAUTIONS: ,  None  RED FLAGS: None   WEIGHT BEARING RESTRICTIONS:  No  FALLS:  Has patient fallen in last 6 months? No   OCCUPATION:  none  PLOF:  Independent  PATIENT GOALS:  Reduce pain  OBJECTIVE:  Note: Objective measures were completed at Evaluation unless otherwise noted.  DIAGNOSTIC FINDINGS:  Neck CT IMPRESSION: 1. 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 Wide patency of the canal and foramina. 2. C3-4: Spondylosis. Advanced facet arthropathy on the left, likely to be painful. Bony encroachment upon the foramen on the left could affect the C4 nerve. 3. C5-6: Degenerative spondylosis. Mild to moderate bony foraminal narrowing on the left. 4. C6-7: Endplate and uncovertebral osteophytes more prominent on the left. Moderate bony foraminal narrowing on the left.  PATIENT SURVEYS:  Patient-Specific Activity Scoring Scheme  0 represents "unable to perform." 10 represents "able to perform at prior level. 0 1 2 3 4 5 6 7 8 9  10 (Date and Score)   Activity Eval     1. Cleaning the house 7    2.       3.     4.    5.    Score     Total score = sum of the activity scores/number of activities Minimum detectable change (90%CI) for average score = 2 points Minimum detectable change (90%CI) for single activity score = 3  points     EDEMA:  No  POSTURE:  rounded shoulders and forward head    CERVICAL ROM:   Active ROM AROM (deg) eval  Flexion 50  Extension Limited by vertigo  Right lateral flexion 20  Left lateral flexion 10  Right rotation WNL  Left rotation WNL   (Blank rows = not tested)   SHOULDER ROM WNL but pain going behind back  Strength: grossly 5/5 MMT bilat UE'S  SPECIAL TESTS:  Cervical Spurling's test: Negative                                                                                                                             TREATMENT DATE:  Eval HEP creation and review with demonstration and trial set preformed, see below for details Manual therapy: soft tissue mobilization and cupping therapy to left cervical P.S, upper trap.     PATIENT EDUCATION: Education details: HEP, PT plan of care, selfcare Person educated: Patient Education method: Explanation, Demonstration, Verbal cues, and Handouts Education comprehension: verbalized understanding, further education recommended   HOME EXERCISE PROGRAM: Access Code: [email or web address] URL: [email or web address] Date: 06/28/2024 Prepared by: [name]  Exercises - Seated Assisted Cervical Rotation with Towel  - 2 x daily - 6 x weekly - 1 sets - 10 reps - 5 sec hold - Standing Scapular Retraction with External Rotation  - 2 x daily - 6 x weekly -  1 sets - 10 reps - 5 sec hold - Seated Cervical Sidebending Stretch  - 2 x daily - 6 x weekly - 1 sets - 3 reps - 10 sec hold - Standing Isometric Cervical Sidebending with Manual Resistance  - 2 x daily - 6 x weekly - 1 sets - 10 reps - 5 sec hold - Standing Isometric Cervical Flexion with Manual Resistance  - 2 x daily - 6 x weekly - 1 sets - 10 reps - 5 sec hold - Standing Isometric Cervical Extension with Manual Resistance  - 2 x daily - 6 x weekly - 1 sets - 10 reps - 5 sec hold  ASSESSMENT:  CLINICAL IMPRESSION: Patient referred to PT for neck pain  that radiates into her left scapular area. No radicular symptoms present today on exam. She does have some Cervical spondyosis and advanced facet arthropathy on left side of neck noted in CT scan. She has increased muscle tension noted in her cervical P.S. and upper traps. Patient will benefit from skilled PT to improve overall function and to address impairments and limitations listed below.  OBJECTIVE IMPAIRMENTS: decreased activity tolerance for ADL's, decreased endurance, decreased ROM, decreased strength, impaired flexibility, impaired UE use, and pain.  ACTIVITY LIMITATIONS: scanning, driving, reaching, carry, sleeping  PERSONAL FACTORS: see above PMH  also affecting patient's functional outcome.  REHAB POTENTIAL: Good  CLINICAL DECISION MAKING: Stable/uncomplicated  EVALUATION COMPLEXITY: Low    GOALS: Short term PT Goals Target date: 07/26/2024   Pt will be I and compliant with HEP. Baseline:  Goal status: New Pt will decrease pain by 25% overall Baseline: Goal status: New  Long term PT goals Target date:08/23/2024   Pt will improve neck ROM to [facility] to improve functional mobility Baseline: Goal status: New Pt will improve Patient specific functional scale (PSFS) to at least 9/10 to show improved function level Baseline: Goal status: New Pt will reduce pain to overall less than 3/10 with usual activity and work activity. Baseline: Goal status: New  PLAN: PT FREQUENCY: 1-2 times per week   PT DURATION: 6-8 weeks  PLANNED INTERVENTIONS  97110-Therapeutic exercises, 97530- Therapeutic activity, [id number]- Neuromuscular re-education, 97535- Self Care, [id number]- Manual therapy, and Patient/Family education  PLAN FOR NEXT SESSION: She did have history of cervical fusion in 1998, and spinal stimulator 2004 so no traction or electric modalaties. Can consider DN  Redell JONELLE Moose, PT,DPT 06/28/2024, 3:34 PM

## 2024-06-28 ENCOUNTER — Ambulatory Visit: Attending: Internal Medicine | Admitting: Physical Therapy

## 2024-06-28 ENCOUNTER — Encounter: Payer: Self-pay | Admitting: Physical Therapy

## 2024-06-28 DIAGNOSIS — M25512 Pain in left shoulder: Secondary | ICD-10-CM | POA: Insufficient documentation

## 2024-06-28 DIAGNOSIS — M542 Cervicalgia: Secondary | ICD-10-CM | POA: Insufficient documentation

## 2024-06-28 DIAGNOSIS — G8929 Other chronic pain: Secondary | ICD-10-CM | POA: Insufficient documentation

## 2024-06-29 DIAGNOSIS — H5213 Myopia, bilateral: Secondary | ICD-10-CM | POA: Diagnosis not present

## 2024-06-29 DIAGNOSIS — H524 Presbyopia: Secondary | ICD-10-CM | POA: Diagnosis not present

## 2024-06-29 DIAGNOSIS — H52223 Regular astigmatism, bilateral: Secondary | ICD-10-CM | POA: Diagnosis not present

## 2024-07-12 ENCOUNTER — Ambulatory Visit: Attending: Internal Medicine | Admitting: Physical Therapy

## 2024-07-12 ENCOUNTER — Encounter: Payer: Self-pay | Admitting: Physical Therapy

## 2024-07-12 DIAGNOSIS — G8929 Other chronic pain: Secondary | ICD-10-CM | POA: Diagnosis not present

## 2024-07-12 DIAGNOSIS — M25512 Pain in left shoulder: Secondary | ICD-10-CM | POA: Diagnosis not present

## 2024-07-12 DIAGNOSIS — M542 Cervicalgia: Secondary | ICD-10-CM | POA: Insufficient documentation

## 2024-07-12 NOTE — Therapy (Addendum)
 OUTPATIENT PHYSICAL THERAPY TREATMENT/Discharge addendum PHYSICAL THERAPY DISCHARGE SUMMARY  Visits from Start of Care: 2  Current functional level related to goals / functional outcomes: See below   Remaining deficits: See below   Education / Equipment: HEP  Plan:  Patient goals were not met. Patient is being discharged to have surgery.  [name], PT, DPT 07/25/24 3:41 PM       Patient Name: [name] MRN: [id number] DOB:Apr 15, 1956, 68 y.o., female Today's Date: 07/12/2024  END OF SESSION:  PT End of Session - 07/12/24 0936     Visit Number 2    Number of Visits 10    Date for Recertification  08/23/24    PT Start Time 0930    PT Stop Time 1015    PT Time Calculation (min) 45 min    Activity Tolerance Patient tolerated treatment well    Behavior During Therapy [facility] for tasks assessed/performed          Past Medical History:  Diagnosis Date   Chronic back pain    Complication of anesthesia    shaking and chattery   High cholesterol    Past Surgical History:  Procedure Laterality Date   8 back surgeries     COLONOSCOPY  10/14/2011   Procedure: COLONOSCOPY;  Surgeon: Claudis RAYMOND Rivet, MD;  [facility];  Service: Endoscopy;  Laterality: N/A;  1030   COLONOSCOPY WITH PROPOFOL  N/A 11/19/2021   Procedure: COLONOSCOPY WITH PROPOFOL ;  Surgeon: Rivet Claudis RAYMOND, MD;  [facility];  Service: Endoscopy;  Laterality: N/A;  10:00   left foot surgery     PARTIAL HYSTERECTOMY     Patient Active Problem List   Diagnosis Date Noted   Orthostatic hypotension 06/03/2022   Educated about COVID-19 virus infection 02/23/2019   Lumbar spondylosis 03/29/2018   Idiopathic hypotension 12/20/2015   Chronic back pain 09/21/2011    PCP: Bertell Satterfield, MD   REFERRING PROVIDER: Bertell Satterfield, MD   REFERRING DIAG: M54.2 (ICD-10-CM) - Cervicalgia   Rationale for Evaluation and Treatment:  Rehabiliation  THERAPY DIAG:  Cervicalgia  Chronic  left shoulder pain  ONSET DATE: 1 month of increased pain, chronic pain   SUBJECTIVE:                                                                                                                                                                                           SUBJECTIVE STATEMENT: She says her neck pain is bad today, has been trying to do the exercises but they may be bothering her.She felt like the cupping helped but only for short amount of  time so she is interested in trying the Dry needling.   PERTINENT HISTORY:  See above PMH, She did have history of cervical fusion in 1998, and spinal stimulator 2004, 8 back surgeries  PAIN:  NPRS scale: 9/10 upon arrival Pain location:neck and left shoulder Pain description: constant unless laying down, burning pain and sharp Aggravating factors: being up, not laying down, bringing arm behind back Relieving factors: rest by laying down, no improvement from ice or heat   PRECAUTIONS: ,  None  RED FLAGS: None   WEIGHT BEARING RESTRICTIONS:  No  FALLS:  Has patient fallen in last 6 months? No   OCCUPATION:  none  PLOF:  Independent  PATIENT GOALS:  Reduce pain  OBJECTIVE:  Note: Objective measures were completed at Evaluation unless otherwise noted.  DIAGNOSTIC FINDINGS:  Neck CT IMPRESSION: 1. 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 Wide patency of the canal and foramina. 2. C3-4: Spondylosis. Advanced facet arthropathy on the left, likely to be painful. Bony encroachment upon the foramen on the left could affect the C4 nerve. 3. C5-6: Degenerative spondylosis. Mild to moderate bony foraminal narrowing on the left. 4. C6-7: Endplate and uncovertebral osteophytes more prominent on the left. Moderate bony foraminal narrowing on the left.  PATIENT SURVEYS:  Patient-Specific Activity Scoring Scheme  0 represents "unable to perform." 10 represents "able to perform at prior level. 0 1 2 3 4 5 6 7 8 9  10 (Date and  Score)   Activity Eval     1. Cleaning the house 7    2.       3.     4.    5.    Score     Total score = sum of the activity scores/number of activities Minimum detectable change (90%CI) for average score = 2 points Minimum detectable change (90%CI) for single activity score = 3 points     EDEMA:  No  POSTURE:  rounded shoulders and forward head    CERVICAL ROM:   Active ROM AROM (deg) eval  Flexion 50  Extension Limited by vertigo  Right lateral flexion 20  Left lateral flexion 10  Right rotation WNL  Left rotation WNL   (Blank rows = not tested)   SHOULDER ROM WNL but pain going behind back  Strength: grossly 5/5 MMT bilat UE'S  SPECIAL TESTS:  Cervical Spurling's test: Negative                                                                                                                             TREATMENT DATE:  Eval HEP revision and review while she was on moist heat X 10 min Cervical retractions X10 reps Scapular retractions X 10 reps Cervical rotation stretch with towel 5 sec X 10 bilat Manual therapy: soft tissue mobilization, trigger point release to left cervical P.S, upper trap. Gentle cervical distraction stretching with towel 30 sec X 5 Trigger Point Dry Needling Initial Treatment: Pt instructed on  Dry Needling rational, procedures, and possible side effects. Pt instructed to expect mild to moderate muscle soreness later in the day and/or into the next day.  Pt instructed in methods to reduce muscle soreness. Pt instructed to continue prescribed HEP. Because Dry Needling was performed over or adjacent to a lung field, pt was educated on S/S of pneumothorax and to seek immediate medical attention should they occur.  Patient was educated on signs and symptoms of infection and other risk factors and advised to seek medical attention should they occur.  Patient verbalized understanding of these instructions and education.  Patient Verbal  Consent Given: Yes Education Handout Provided: Yes Muscles Treated: left cervical paraspinals/uppertrap/subiccital Electrical Stimulation Performed: No  Treatment Response/Outcome:  good overall tolerance,twitch response noted          PATIENT EDUCATION: Education details: HEP, PT plan of care, selfcare Person educated: Patient Education method: Explanation, Demonstration, Verbal cues, and Handouts Education comprehension: verbalized understanding, further education recommended   HOME EXERCISE PROGRAM: Access Code: [email or web address] URL: [email or web address] Date: 07/12/2024 Prepared by: [name]  Exercises - Seated Assisted Cervical Rotation with Towel  - 2 x daily - 6 x weekly - 1 sets - 10 reps - 5 sec hold - Seated Cervical Retraction  - 2 x daily - 6 x weekly - 1 sets - 10 reps - 5 sec hold - Seated Scapular Retraction  - 2 x daily - 6 x weekly - 1 sets - 10 reps - 5 sec hold ASSESSMENT:  CLINICAL IMPRESSION: I changed her HEP today to see if this will help improve her exercise tolerance to them and cautioned her to be gentle with them and not push into pain. We also tried Dry needling today to see if this will help with her pain.    Per eval: Patient referred to PT for neck pain that radiates into her left scapular area. No radicular symptoms present today on exam. She does have some Cervical spondyosis and advanced facet arthropathy on left side of neck noted in CT scan. She has increased muscle tension noted in her cervical P.S. and upper traps. Patient will benefit from skilled PT to improve overall function and to address impairments and limitations listed below.  OBJECTIVE IMPAIRMENTS: decreased activity tolerance for ADL's, decreased endurance, decreased ROM, decreased strength, impaired flexibility, impaired UE use, and pain.  ACTIVITY LIMITATIONS: scanning, driving, reaching, carry, sleeping  PERSONAL FACTORS: see above PMH  also affecting patient's  functional outcome.  REHAB POTENTIAL: Good  CLINICAL DECISION MAKING: Stable/uncomplicated  EVALUATION COMPLEXITY: Low    GOALS: Short term PT Goals Target date: 07/26/2024   Pt will be I and compliant with HEP. Baseline:  Goal status: New Pt will decrease pain by 25% overall Baseline: Goal status: New  Long term PT goals Target date:08/23/2024   Pt will improve neck ROM to [facility] to improve functional mobility Baseline: Goal status: New Pt will improve Patient specific functional scale (PSFS) to at least 9/10 to show improved function level Baseline: Goal status: New Pt will reduce pain to overall less than 3/10 with usual activity and work activity. Baseline: Goal status: New  PLAN: PT FREQUENCY: 1-2 times per week   PT DURATION: 6-8 weeks  PLANNED INTERVENTIONS  97110-Therapeutic exercises, 97530- Therapeutic activity, [id number]- Neuromuscular re-education, 97535- Self Care, [id number]- Manual therapy, and Patient/Family education  PLAN FOR NEXT SESSION: She did have history of cervical fusion in 1998, and spinal stimulator 2004 so no traction or electric modalaties. How was DN?  Brooke JONELLE Barnes, PT,DPT 07/12/2024, 10:19 AM

## 2024-07-12 NOTE — Patient Instructions (Signed)

## 2024-07-18 DIAGNOSIS — M5412 Radiculopathy, cervical region: Secondary | ICD-10-CM | POA: Diagnosis not present

## 2024-07-18 DIAGNOSIS — M542 Cervicalgia: Secondary | ICD-10-CM | POA: Diagnosis not present

## 2024-07-20 ENCOUNTER — Telehealth (HOSPITAL_BASED_OUTPATIENT_CLINIC_OR_DEPARTMENT_OTHER): Payer: Self-pay | Admitting: *Deleted

## 2024-07-20 ENCOUNTER — Telehealth (HOSPITAL_BASED_OUTPATIENT_CLINIC_OR_DEPARTMENT_OTHER): Payer: Self-pay

## 2024-07-20 NOTE — Telephone Encounter (Signed)
   Pre-operative Risk Assessment    Patient Name: [name]  DOB: Aug 23, 1956 MRN: [id number]   Date of last office visit: 10/14/23 with Dr. Lavona Date of next office visit: N/A   Request for Surgical Clearance    Procedure:  C3-4 Anterior Cervical Fusion  Date of Surgery:  Clearance TBD                                Surgeon:  Alm Molt, MD Surgeon's Group or Practice Name:  [facility] Phone number: [phone] Fax number:  [phone]   Type of Clearance Requested:   - Medical  - Pharmacy:  Hold Aspirin  not indicated   Type of Anesthesia:  General    Additional requests/questions:    SignedAugustin JONETTA Daring   07/20/2024, 7:44 AM

## 2024-07-20 NOTE — Telephone Encounter (Signed)
 I s/w the pt and she has been scheduled a tele preop appt 07/26/24. Pt states surgeon is waiting for clearance before scheduling procedure. Med rec and consent are done.      Patient Consent for Virtual Visit        [name] has provided verbal consent on 07/20/2024 for a virtual visit (video or telephone).   CONSENT FOR VIRTUAL VISIT FOR:  [name]  By participating in this virtual visit I agree to the following:  I hereby voluntarily request, consent and authorize [facility] and its employed or contracted physicians, physician assistants, nurse practitioners or other licensed health care professionals (the Practitioner), to provide me with telemedicine health care services (the "Services) as deemed necessary by the treating Practitioner. I acknowledge and consent to receive the Services by the Practitioner via telemedicine. I understand that the telemedicine visit will involve communicating with the Practitioner through live audiovisual communication technology and the disclosure of certain medical information by electronic transmission. I acknowledge that I have been given the opportunity to request an in-person assessment or other available alternative prior to the telemedicine visit and am voluntarily participating in the telemedicine visit.  I understand that I have the right to withhold or withdraw my consent to the use of telemedicine in the course of my care at any time, without affecting my right to future care or treatment, and that the Practitioner or I may terminate the telemedicine visit at any time. I understand that I have the right to inspect all information obtained and/or recorded in the course of the telemedicine visit and may receive copies of available information for a reasonable fee.  I understand that some of the potential risks of receiving the Services via telemedicine include:  Delay or interruption in medical evaluation due to technological  equipment failure or disruption; Information transmitted may not be sufficient (e.g. poor resolution of images) to allow for appropriate medical decision making by the Practitioner; and/or  In rare instances, security protocols could fail, causing a breach of personal health information.  Furthermore, I acknowledge that it is my responsibility to provide information about my medical history, conditions and care that is complete and accurate to the best of my ability. I acknowledge that Practitioner's advice, recommendations, and/or decision may be based on factors not within their control, such as incomplete or inaccurate data provided by me or distortions of diagnostic images or specimens that may result from electronic transmissions. I understand that the practice of medicine is not an exact science and that Practitioner makes no warranties or guarantees regarding treatment outcomes. I acknowledge that a copy of this consent can be made available to me via my patient portal [facility] MyChart), or I can request a printed copy by calling the office of [facility].    I understand that my insurance will be billed for this visit.   I have read or had this consent read to me. I understand the contents of this consent, which adequately explains the benefits and risks of the Services being provided via telemedicine.  I have been provided ample opportunity to ask questions regarding this consent and the Services and have had my questions answered to my satisfaction. I give my informed consent for the services to be provided through the use of telemedicine in my medical care

## 2024-07-20 NOTE — Telephone Encounter (Signed)
   Name: [name]  DOB: 1955/10/18  MRN: [id number]  Primary Cardiologist: Lynwood Schilling, MD  Preoperative team, please contact this patient and set up a phone call appointment for further preoperative risk assessment. Please obtain consent and complete medication review. Thank you for your help.  I confirm that guidance regarding antiplatelet and oral anticoagulation therapy has been completed and, if necessary, noted below.  Aspirin  may be held for 5-7 days prior to procedure if patient is not having a new symptoms at time of virtual visit.   I also confirmed the patient resides in the state of [place] . As per [facility] Medical Board telemedicine laws, the patient must reside in the state in which the provider is licensed.  Thanos Cousineau D Ruthmary Occhipinti, NP 07/20/2024, 9:15 AM [facility]

## 2024-07-20 NOTE — Telephone Encounter (Signed)
 I s/w the pt and she has been scheduled a tele preop appt 07/26/24. Pt states surgeon is waiting for clearance before scheduling procedure. Med rec and consent are done.

## 2024-07-25 ENCOUNTER — Ambulatory Visit: Admitting: Physical Therapy

## 2024-07-26 ENCOUNTER — Ambulatory Visit: Attending: Cardiology | Admitting: Nurse Practitioner

## 2024-07-26 DIAGNOSIS — Z01810 Encounter for preprocedural cardiovascular examination: Secondary | ICD-10-CM | POA: Diagnosis not present

## 2024-07-26 NOTE — Progress Notes (Signed)
 Virtual Visit via Telephone Note   Because of DASHAUNA HEYMANN co-morbid illnesses, she is at least at moderate risk for complications without adequate follow up.  This format is felt to be most appropriate for this patient at this time.  Due to technical limitations with video connection Web designer), today's appointment will be conducted as an audio only telehealth visit, and RAMATA STROTHMAN verbally agreed to proceed in this manner.   All issues noted in this document were discussed and addressed.  No physical exam could be performed with this format.  Evaluation Performed:  Preoperative cardiovascular risk assessment _____________   Date:  07/26/2024   Patient ID:  MALIE KASHANI, DOB 02-10-1956, MRN [id number] Patient Location:  Home Provider location:   Office  Primary Care Provider:  Bertell Satterfield, MD Primary Cardiologist:  Lynwood Schilling, MD  Chief Complaint / Patient Profile   68 y.o. y/o female with a h/o mild nonobstructive CAD, bradycardia, orthostatic hypotension, and hyperlipidemia who is pending C3-4 anterior cervical fusion with Dr. Alm Molt of [facility] and presents today for telephonic preoperative cardiovascular risk assessment.  History of Present Illness    DANELIA SNODGRASS is a 68 y.o. female who presents via audio/video conferencing for a telehealth visit today.  Pt was last seen in cardiology clinic on 10/14/2023 by Dr. Schilling.  At that time STAISHA WINIARSKI reported atypical chest discomfort.  ETT was inconclusive.  Coronary CT angiogram in 11/2023 revealed coronary calcium score of 4.13 (50th percentile), nonobstructive CAD.  The patient is now pending procedure as outlined above. Since her last visit, she has done well from a cardiac standpoint.   She denies chest pain, palpitations, dyspnea, pnd, orthopnea, n, v, dizziness, syncope, edema, weight gain, or early satiety. All other systems reviewed and are otherwise negative except  as noted above.   Past Medical History    Past Medical History:  Diagnosis Date   Chronic back pain    Complication of anesthesia    shaking and chattery   High cholesterol    Past Surgical History:  Procedure Laterality Date   8 back surgeries     COLONOSCOPY  10/14/2011   Procedure: COLONOSCOPY;  Surgeon: Claudis RAYMOND Rivet, MD;  Location: [facility];  Service: Endoscopy;  Laterality: N/A;  1030   COLONOSCOPY WITH PROPOFOL  N/A 11/19/2021   Procedure: COLONOSCOPY WITH PROPOFOL ;  Surgeon: Rivet Claudis RAYMOND, MD;  Location: [facility];  Service: Endoscopy;  Laterality: N/A;  10:00   left foot surgery     PARTIAL HYSTERECTOMY      Allergies  Allergies  Allergen Reactions   2,4-D Dimethylamine Rash    Positive in patch testing Fragrance mix 1 and cinnamic aldehyde   Balsam Rash    Positive in patch testing     Latex Rash   Methylisothiazolinone Rash    Positive in patch testing     Propolis Rash    Positive in patch testing     Shellac Rash    Positive in patch testing      Home Medications    Prior to Admission medications   Medication Sig Start Date End Date Taking? Authorizing Provider  aspirin  EC 81 MG tablet Take 81 mg by mouth at bedtime.    [provider]  celecoxib (CELEBREX) 200 MG capsule Take 200 mg by mouth 2 (two) times daily.    [provider]  Cholecalciferol (VITAMIN D3) 250 MCG (10000 UT) capsule Take  10,000 Units by mouth daily.    [provider]  estradiol (ESTRACE) 0.5 MG tablet Take 0.5 mg by mouth daily.    [provider]  HYDROcodone-acetaminophen  (NORCO) 10-325 MG tablet Take 1 tablet by mouth every 4 (four) hours as needed for moderate pain.    [provider]  LUTEIN PO Take 18 mg by mouth daily.    [provider]  metoprolol  tartrate (LOPRESSOR ) 100 MG tablet Take 1 tablet (100 mg total) by mouth once for 1 dose. Take 2 hours prior to CT test. Patient not taking: Reported on  07/20/2024 11/08/23 07/20/24  Lavona Agent, MD  midodrine  (PROAMATINE ) 5 MG tablet TAKE 2 TABLETS IN THE MORNING, 2 TABLETS AT LUNCH, AND 2 TABLET IN THE EVENING (5 HOURS PRIOR TO BEDTIME). Patient not taking: Reported on 07/20/2024 01/13/23   Lavona Agent, MD  Omega-3 Fatty Acids (FISH OIL) 1000 MG CAPS Take 1,000 mg by mouth daily.    [provider]  omeprazole  (PRILOSEC) 20 MG capsule Take 1 capsule (20 mg total) by mouth daily. 10/12/23 07/20/24  Yolande Lamar BROCKS, MD  RESTASIS 0.05 % ophthalmic emulsion Place 1 drop into both eyes 2 (two) times daily. 05/14/23   [provider]  rosuvastatin (CRESTOR) 10 MG tablet Take 10 mg by mouth daily.    [provider]  terbinafine (LAMISIL) 250 MG tablet Take 250 mg by mouth in the morning and at bedtime. 10/07/23   [provider]  tolterodine (DETROL LA) 4 MG 24 hr capsule Take 4 mg by mouth daily.    [provider]  TURMERIC PO Take 1,000 mg by mouth in the morning and at bedtime.    [provider]    Physical Exam    Vital Signs:  FLOWER FRANKO does not have vital signs available for review today.  Given telephonic nature of communication, physical exam is limited. AAOx3. NAD. Normal affect.  Speech and respirations are unlabored.  Accessory Clinical Findings    None  Assessment & Plan    1.  Preoperative Cardiovascular Risk Assessment:  According to the Revised Cardiac Risk Index (RCRI), her Perioperative Risk of Major Cardiac Event is (%): 0.4. Her Functional Capacity in METs is: 7.01 according to the Duke Activity Status Index (DASI).Therefore, based on ACC/AHA guidelines, patient would be at acceptable risk for the planned procedure without further cardiovascular testing.   The patient was advised that if she develops new symptoms prior to surgery to contact our office to arrange for a follow-up visit, and she verbalized understanding.  Per office protocol, she may hold  Aspirin  for 5-7 days prior to procedure. Please resume Aspirin  as soon as possible postprocedure, at the discretion of the surgeon.   A copy of this note will be routed to requesting surgeon.  Time:   Today, I have spent 5 minutes with the patient with telehealth technology discussing medical history, symptoms, and management plan.     Damien BROCKS Braver, NP  07/26/2024, 9:51 AM

## 2024-08-01 ENCOUNTER — Encounter: Admitting: Physical Therapy

## 2024-08-01 DIAGNOSIS — M542 Cervicalgia: Secondary | ICD-10-CM | POA: Diagnosis not present

## 2024-08-02 ENCOUNTER — Ambulatory Visit: Admitting: Physical Therapy

## 2024-08-09 DIAGNOSIS — M4802 Spinal stenosis, cervical region: Secondary | ICD-10-CM | POA: Diagnosis not present

## 2024-08-09 DIAGNOSIS — M4722 Other spondylosis with radiculopathy, cervical region: Secondary | ICD-10-CM | POA: Diagnosis not present

## 2024-08-09 DIAGNOSIS — M5412 Radiculopathy, cervical region: Secondary | ICD-10-CM | POA: Diagnosis not present

## 2024-09-06 DIAGNOSIS — L299 Pruritus, unspecified: Secondary | ICD-10-CM | POA: Diagnosis not present

## 2024-09-06 DIAGNOSIS — L280 Lichen simplex chronicus: Secondary | ICD-10-CM | POA: Diagnosis not present

## 2024-09-12 ENCOUNTER — Encounter: Payer: Self-pay | Admitting: Physician Assistant

## 2024-09-12 ENCOUNTER — Ambulatory Visit: Admitting: Physician Assistant

## 2024-09-12 VITALS — BP 110/71 | HR 75 | Temp 97.2°F | Ht 68.0 in | Wt 211.4 lb

## 2024-09-12 DIAGNOSIS — M47812 Spondylosis without myelopathy or radiculopathy, cervical region: Secondary | ICD-10-CM

## 2024-09-12 DIAGNOSIS — M5412 Radiculopathy, cervical region: Secondary | ICD-10-CM | POA: Insufficient documentation

## 2024-09-12 DIAGNOSIS — E782 Mixed hyperlipidemia: Secondary | ICD-10-CM

## 2024-09-12 DIAGNOSIS — E785 Hyperlipidemia, unspecified: Secondary | ICD-10-CM | POA: Insufficient documentation

## 2024-09-12 DIAGNOSIS — Z7689 Persons encountering health services in other specified circumstances: Secondary | ICD-10-CM

## 2024-09-12 NOTE — Assessment & Plan Note (Signed)
 Follows regularly with orthopedics. Healing well post operatively. Continue current treatment plan.

## 2024-09-12 NOTE — Assessment & Plan Note (Signed)
 Stable. Continue with current management without changes. Discussed healthy diet and lifestyle.

## 2024-09-12 NOTE — Progress Notes (Signed)
 New Patient Office Visit  Subjective    Patient ID: [name], female    DOB: [birth year]  Age: 68 y.o. MRN: [id number]  CC:  Chief Complaint  Patient presents with   New Patient (Initial Visit)    Patient is a new patient. She has a spine stimulator. She has had spine surgery around 6/7 times including C surgery at the neck 5 weeks ago She used to be on blood pressure medication but has been off of it for 8/9 months now     HPI [name] presents to establish care  Discussed the use of AI scribe software for clinical note transcription with the patient, who gave verbal consent to proceed.  History of Present Illness [name] is a 68 year old female who presents for establishment of care and medication management.  She is five weeks post-cervical surgery with marked improvement after four months of severe preoperative pain that kept her bedbound. She has had multiple prior back surgeries and has a spinal cord stimulator.  She takes Celebrex daily for pain and feels unable to function without it. She uses narcotic pain medication rarely as needed, which her spinal doctor manages.  She takes a statin for high cholesterol, estradiol for hormone replacement therapy, acid reflux medication, terbinafine as needed for fungal infection, medication for bladder leakage, and Restasis for chronic dry eyes.  Her medical history includes low blood pressure, previously treated with medication until about eight months ago, and hysterectomy in 1987.  Family history includes her father with prostate cancer, polyarteritis nodosa, and heart disease.  She does not smoke, drink alcohol, or use drugs. She does not have concerns or complaints at this time.     Outpatient Encounter Medications as of 09/12/2024  Medication Sig   aspirin  EC 81 MG tablet Take 81 mg by mouth at bedtime.   celecoxib (CELEBREX) 200 MG capsule Take 200 mg by mouth 2 (two) times daily.   Cholecalciferol  (VITAMIN D3) 250 MCG (10000 UT) capsule Take 10,000 Units by mouth daily.   estradiol (ESTRACE) 0.5 MG tablet Take 0.5 mg by mouth daily.   HYDROcodone-acetaminophen  (NORCO) 10-325 MG tablet Take 1 tablet by mouth every 4 (four) hours as needed for moderate pain.   LUTEIN PO Take 18 mg by mouth daily.   Omega-3 Fatty Acids (FISH OIL) 1000 MG CAPS Take 1,000 mg by mouth daily.   omeprazole  (PRILOSEC) 20 MG capsule Take 1 capsule (20 mg total) by mouth daily.   RESTASIS 0.05 % ophthalmic emulsion Place 1 drop into both eyes 2 (two) times daily.   rosuvastatin (CRESTOR) 10 MG tablet Take 10 mg by mouth daily.   terbinafine (LAMISIL) 250 MG tablet Take 250 mg by mouth in the morning and at bedtime.   tolterodine (DETROL LA) 4 MG 24 hr capsule Take 4 mg by mouth daily.   TURMERIC PO Take 1,000 mg by mouth in the morning and at bedtime.   [DISCONTINUED] metoprolol  tartrate (LOPRESSOR ) 100 MG tablet Take 1 tablet (100 mg total) by mouth once for 1 dose. Take 2 hours prior to CT test. (Patient not taking: Reported on 07/20/2024)   [DISCONTINUED] midodrine  (PROAMATINE ) 5 MG tablet TAKE 2 TABLETS IN THE MORNING, 2 TABLETS AT LUNCH, AND 2 TABLET IN THE EVENING (5 HOURS PRIOR TO BEDTIME). (Patient not taking: Reported on 07/20/2024)   [DISCONTINUED] triamcinolone  acetonide (KENALOG ) 10 MG/ML injection 10 mg    No facility-administered encounter medications on file as of  09/12/2024.    Past Medical History:  Diagnosis Date   Chronic back pain    Complication of anesthesia    shaking and chattery   High cholesterol     Past Surgical History:  Procedure Laterality Date   8 back surgeries     COLONOSCOPY  10/14/2011   Procedure: COLONOSCOPY;  Surgeon: Claudis RAYMOND Rivet, MD;  Location: [facility];  Service: Endoscopy;  Laterality: N/A;  1030   COLONOSCOPY WITH PROPOFOL  N/A 11/19/2021   Procedure: COLONOSCOPY WITH PROPOFOL ;  Surgeon: Rivet Claudis RAYMOND, MD;  Location: [facility];  Service: Endoscopy;   Laterality: N/A;  10:00   left foot surgery     PARTIAL HYSTERECTOMY      Family History  Problem Relation Age of Onset   Heart attack Father 10       CABG   CAD Father    Prostate cancer Father    Heart disease Paternal Grandfather     Social History   Socioeconomic History   Marital status: Married    Spouse name: Not on file   Number of children: 2   Years of education: Not on file   Highest education level: Not on file  Occupational History   Not on file  Tobacco Use   Smoking status: Never   Smokeless tobacco: Never  Substance and Sexual Activity   Alcohol use: No   Drug use: No   Sexual activity: Not on file  Other Topics Concern   Not on file  Social History Narrative   Lives at home with husband.     Social Drivers of Corporate Investment Banker Strain: Not on file  Food Insecurity: Not on file  Transportation Needs: Not on file  Physical Activity: Not on file  Stress: Not on file  Social Connections: Not on file  Intimate Partner Violence: Not on file    Review of Systems  Constitutional:  Negative for activity change, appetite change, fatigue and fever.  Eyes:  Negative for visual disturbance.  Respiratory:  Negative for cough and shortness of breath.   Cardiovascular:  Negative for chest pain.  Gastrointestinal:  Negative for constipation, diarrhea and vomiting.  Musculoskeletal:  Positive for back pain. Negative for gait problem and myalgias.  Neurological:  Negative for light-headedness and headaches.  Psychiatric/Behavioral:  Negative for agitation and decreased concentration. The patient is not nervous/anxious.          Objective    BP 110/71 (BP Location: Left Arm, Patient Position: Sitting)   Pulse 75   Temp (!) 97.2 F (36.2 C)   Ht 5' 8 (1.727 m)   Wt 211 lb 6 oz (95.9 kg)   SpO2 99%   BMI 32.14 kg/m   Physical Exam Constitutional:      General: She is not in acute distress.    Appearance: Normal appearance. She is  obese. She is not ill-appearing.  HENT:     Head: Normocephalic and atraumatic.     Mouth/Throat:     Mouth: Mucous membranes are moist.     Pharynx: Oropharynx is clear.  Eyes:     Extraocular Movements: Extraocular movements intact.     Conjunctiva/sclera: Conjunctivae normal.  Cardiovascular:     Rate and Rhythm: Normal rate and regular rhythm.     Heart sounds: Normal heart sounds. No murmur heard. Pulmonary:     Effort: Pulmonary effort is normal.     Breath sounds: Normal breath sounds. No wheezing, rhonchi or  rales.  Musculoskeletal:     Right lower leg: No edema.     Left lower leg: No edema.  Skin:    General: Skin is warm and dry.  Neurological:     General: No focal deficit present.     Mental Status: She is alert and oriented to person, place, and time.  Psychiatric:        Mood and Affect: Mood normal.        Behavior: Behavior normal.     Lab work from August 2025 reviewed and abstracted today.     Assessment & Plan:  Encounter to establish care  Mixed hyperlipidemia Assessment & Plan: Stable. Continue with current management without changes. Discussed healthy diet and lifestyle.    Cervical spondylosis Assessment & Plan: Follows regularly with orthopedics. Healing well post operatively. Continue current treatment plan.      Return in about 6 months (around 03/13/2025) for Phys/AWV.   Charmaine Esli Jernigan, PA-C

## 2024-09-13 DIAGNOSIS — M7542 Impingement syndrome of left shoulder: Secondary | ICD-10-CM | POA: Diagnosis not present

## 2024-12-14 ENCOUNTER — Ambulatory Visit

## 2025-03-14 ENCOUNTER — Encounter: Admitting: Physician Assistant
